# Patient Record
Sex: Female | Born: 1965 | Race: Black or African American | Hispanic: No | Marital: Single | Smoking: Never smoker
Health system: Southern US, Community
[De-identification: ages and names within clinical notes are randomized; demographics above are authoritative.]

## PROBLEM LIST (undated history)

## (undated) DIAGNOSIS — F32A Depression, unspecified: Secondary | ICD-10-CM

## (undated) DIAGNOSIS — I10 Essential (primary) hypertension: Secondary | ICD-10-CM

## (undated) DIAGNOSIS — J45909 Unspecified asthma, uncomplicated: Secondary | ICD-10-CM

## (undated) DIAGNOSIS — H11009 Unspecified pterygium of unspecified eye: Secondary | ICD-10-CM

## (undated) DIAGNOSIS — E785 Hyperlipidemia, unspecified: Secondary | ICD-10-CM

## (undated) HISTORY — PX: HERNIA REPAIR: SHX51

## (undated) HISTORY — PX: CHOLECYSTECTOMY: SHX55

---

## 2018-11-23 ENCOUNTER — Emergency Department (HOSPITAL_BASED_OUTPATIENT_CLINIC_OR_DEPARTMENT_OTHER)
Admission: EM | Admit: 2018-11-23 | Discharge: 2018-11-24 | Disposition: A | Discharge status: Home / Self Care | Payer: 59 | Attending: Emergency Medicine | Admitting: Emergency Medicine

## 2018-11-23 ENCOUNTER — Other Ambulatory Visit: Payer: Self-pay

## 2018-11-23 ENCOUNTER — Encounter (HOSPITAL_BASED_OUTPATIENT_CLINIC_OR_DEPARTMENT_OTHER): Payer: Self-pay

## 2018-11-23 DIAGNOSIS — R42 Dizziness and giddiness: Secondary | ICD-10-CM | POA: Insufficient documentation

## 2018-11-23 DIAGNOSIS — R51 Headache: Secondary | ICD-10-CM | POA: Insufficient documentation

## 2018-11-23 DIAGNOSIS — R519 Headache, unspecified: Secondary | ICD-10-CM

## 2018-11-23 HISTORY — DX: Unspecified pterygium of unspecified eye: H11.009

## 2018-11-23 NOTE — ED Triage Notes (Signed)
t HA x 1 week-today at work she felt dizzy-she checked BP at home 148/105-pt does not have HTN hx except during pregnancy at age 53-NAD-steady gait

## 2018-11-24 ENCOUNTER — Emergency Department (HOSPITAL_BASED_OUTPATIENT_CLINIC_OR_DEPARTMENT_OTHER): Payer: 59

## 2018-11-24 LAB — CBC WITH DIFFERENTIAL/PLATELET
Abs Immature Granulocytes: 0.02 10*3/uL (ref 0.00–0.07)
Basophils Absolute: 0 10*3/uL (ref 0.0–0.1)
Basophils Relative: 0 %
Eosinophils Absolute: 0 10*3/uL (ref 0.0–0.5)
Eosinophils Relative: 0 %
HCT: 43.6 % (ref 36.0–46.0)
Hemoglobin: 13.8 g/dL (ref 12.0–15.0)
Immature Granulocytes: 0 %
Lymphocytes Relative: 21 %
Lymphs Abs: 1.3 10*3/uL (ref 0.7–4.0)
MCH: 29.1 pg (ref 26.0–34.0)
MCHC: 31.7 g/dL (ref 30.0–36.0)
MCV: 92 fL (ref 80.0–100.0)
Monocytes Absolute: 0.4 10*3/uL (ref 0.1–1.0)
Monocytes Relative: 7 %
Neutro Abs: 4.6 10*3/uL (ref 1.7–7.7)
Neutrophils Relative %: 72 %
Platelets: 212 10*3/uL (ref 150–400)
RBC: 4.74 MIL/uL (ref 3.87–5.11)
RDW: 12.7 % (ref 11.5–15.5)
WBC: 6.4 10*3/uL (ref 4.0–10.5)
nRBC: 0 % (ref 0.0–0.2)

## 2018-11-24 LAB — BASIC METABOLIC PANEL
Anion gap: 11 (ref 5–15)
BUN: 12 mg/dL (ref 6–20)
CO2: 25 mmol/L (ref 22–32)
Calcium: 9.3 mg/dL (ref 8.9–10.3)
Chloride: 101 mmol/L (ref 98–111)
Creatinine, Ser: 0.96 mg/dL (ref 0.44–1.00)
GFR calc Af Amer: >60 mL/min (ref >60)
GFR calc non Af Amer: >60 mL/min (ref >60)
Glucose, Bld: 103 mg/dL — ABNORMAL HIGH (ref 70–99)
Potassium: 4.1 mmol/L (ref 3.5–5.1)
Sodium: 137 mmol/L (ref 135–145)

## 2018-11-24 LAB — URINALYSIS, ROUTINE W REFLEX MICROSCOPIC
Bilirubin Urine: NEGATIVE
Glucose, UA: NEGATIVE mg/dL
Hgb urine dipstick: NEGATIVE
Ketones, ur: NEGATIVE mg/dL
Leukocytes,Ua: NEGATIVE
Nitrite: NEGATIVE
Protein, ur: NEGATIVE mg/dL
Specific Gravity, Urine: 1.01 (ref 1.005–1.030)
pH: 7 (ref 5.0–8.0)

## 2018-11-24 LAB — MAGNESIUM: Magnesium: 2.3 mg/dL (ref 1.7–2.4)

## 2018-11-24 MED ORDER — METOCLOPRAMIDE HCL 5 MG/ML IJ SOLN
10.0000 mg | Freq: Once | INTRAMUSCULAR | Status: AC
  Administered 2018-11-24: 10 mg via INTRAVENOUS
  Filled 2018-11-24: qty 2

## 2018-11-24 MED ORDER — KETOROLAC TROMETHAMINE 15 MG/ML IJ SOLN
15.0000 mg | Freq: Once | INTRAMUSCULAR | Status: AC
  Administered 2018-11-24: 03:00:00 15 mg via INTRAVENOUS
  Filled 2018-11-24: qty 1

## 2018-11-24 MED ORDER — DIPHENHYDRAMINE HCL 50 MG/ML IJ SOLN
25.0000 mg | Freq: Once | INTRAMUSCULAR | Status: AC
  Administered 2018-11-24: 03:00:00 25 mg via INTRAVENOUS
  Filled 2018-11-24: qty 1

## 2018-11-24 NOTE — ED Notes (Signed)
ED Provider at bedside. 

## 2018-11-24 NOTE — ED Provider Notes (Signed)
[facility] Provider Note: Georgena Spurling, MD, FACEP  CSN: [id number] MRN: [id number] ARRIVAL: 11/23/18 at 2146 ROOM: [place]  Headache   HISTORY OF PRESENT ILLNESS  11/24/18 1:20 AM Kelsey Shaw is a 53 y.o. female who is been having headaches for about a week.  She does not usually have headaches.  Her headaches are generalized and she rates the pain as a 3 out of 10 presently.  She has been taking Tylenol without relief.  She has no associated nausea or vomiting.  She had several episodes of dizziness yesterday.  They lasted about 5 minutes.  She characterizes these as feeling like she is going to fall down or a sensation of lightheadedness.  She has been seeing a neurologist for "twitching" and has a follow-up appointment on October 2.   Past Medical History:  Diagnosis Date  . Pterygium     Past Surgical History:  Procedure Laterality Date  . CESAREAN SECTION    . CHOLECYSTECTOMY    . HERNIA REPAIR      No family history on file.  Social History   Tobacco Use  . Smoking status: Never Smoker  . Smokeless tobacco: Never Used  Substance Use Topics  . Alcohol use: Yes    Comment: occ  . Drug use: Never    Prior to Admission medications   Not on File    Allergies Ivp dye [iodinated diagnostic agents]   REVIEW OF SYSTEMS  Negative except as noted here or in the History of Present Illness.   PHYSICAL EXAMINATION  Initial Vital Signs Blood pressure 127/80, pulse 78, temperature 98.6 F (37 C), temperature source Oral, resp. rate 16, height 5\' 3"  (1.6 m), weight 98 kg, SpO2 100 %.  Examination General: Well-developed, well-nourished female in no acute distress; appearance consistent with age of record HENT: normocephalic; atraumatic Eyes: pupils equal, round and reactive to light; extraocular muscles intact; bilateral pterigium Neck: supple Heart: regular rate and rhythm Lungs: clear to auscultation bilaterally  Abdomen: soft; nondistended; nontender; no masses or hepatosplenomegaly; bowel sounds present Extremities: No deformity; full range of motion; pulses normal Neurologic: Awake, alert and oriented; motor function intact in all extremities and symmetric; no facial droop; no pronator drift; normal finger-to-nose Skin: Warm and dry Psychiatric: Normal mood and affect   RESULTS  Summary of this visit's results, reviewed by myself:   EKG Interpretation  Date/Time:  Thursday November 24 2018 00:13:00 EDT Ventricular Rate:  74 PR Interval:    QRS Duration: 82 QT Interval:  383 QTC Calculation: 425 R Axis:   61 Text Interpretation:  Sinus rhythm Normal ECG Confirmed by Johnte Portnoy [phone]) on 11/24/2018 12:17:42 AM      Laboratory Studies: Results for orders placed or performed during the hospital encounter of 11/23/18 (from the past 24 hour(s))  CBC with Differential/Platelet     Status: None   Collection Time: 11/24/18  1:41 AM  Result Value Ref Range   WBC 6.4 4.0 - 10.5 K/uL   RBC 4.74 3.87 - 5.11 MIL/uL   Hemoglobin 13.8 12.0 - 15.0 g/dL   HCT 43.6 36.0 - 46.0 %   MCV 92.0 80.0 - 100.0 fL   MCH 29.1 26.0 - 34.0 pg   MCHC 31.7 30.0 - 36.0 g/dL   RDW 12.7 11.5 - 15.5 %   Platelets 212 150 - 400 K/uL   nRBC 0.0 0.0 - 0.2 %   Neutrophils Relative % 72 %   Neutro Abs  4.6 1.7 - 7.7 K/uL   Lymphocytes Relative 21 %   Lymphs Abs 1.3 0.7 - 4.0 K/uL   Monocytes Relative 7 %   Monocytes Absolute 0.4 0.1 - 1.0 K/uL   Eosinophils Relative 0 %   Eosinophils Absolute 0.0 0.0 - 0.5 K/uL   Basophils Relative 0 %   Basophils Absolute 0.0 0.0 - 0.1 K/uL   Immature Granulocytes 0 %   Abs Immature Granulocytes 0.02 0.00 - 0.07 K/uL  Basic metabolic panel     Status: Abnormal   Collection Time: 11/24/18  1:41 AM  Result Value Ref Range   Sodium 137 135 - 145 mmol/L   Potassium 4.1 3.5 - 5.1 mmol/L   Chloride 101 98 - 111 mmol/L   CO2 25 22 - 32 mmol/L   Glucose, Bld 103 (H) 70 - 99  mg/dL   BUN 12 6 - 20 mg/dL   Creatinine, Ser 1.610.96 0.44 - 1.00 mg/dL   Calcium 9.3 8.9 - 09.610.3 mg/dL   GFR calc non Af Amer >60 >60 mL/min   GFR calc Af Amer >60 >60 mL/min   Anion gap 11 5 - 15  Magnesium     Status: None   Collection Time: 11/24/18  1:41 AM  Result Value Ref Range   Magnesium 2.3 1.7 - 2.4 mg/dL  Urinalysis, Routine w reflex microscopic     Status: None   Collection Time: 11/24/18  1:41 AM  Result Value Ref Range   Color, Urine YELLOW YELLOW   APPearance CLEAR CLEAR   Specific Gravity, Urine 1.010 1.005 - 1.030   pH 7.0 5.0 - 8.0   Glucose, UA NEGATIVE NEGATIVE mg/dL   Hgb urine dipstick NEGATIVE NEGATIVE   Bilirubin Urine NEGATIVE NEGATIVE   Ketones, ur NEGATIVE NEGATIVE mg/dL   Protein, ur NEGATIVE NEGATIVE mg/dL   Nitrite NEGATIVE NEGATIVE   Leukocytes,Ua NEGATIVE NEGATIVE   Imaging Studies: Ct Head Wo Contrast  Result Date: 11/24/2018 CLINICAL DATA:  New onset headaches EXAM: CT HEAD WITHOUT CONTRAST TECHNIQUE: Contiguous axial images were obtained from the base of the skull through the vertex without intravenous contrast. COMPARISON:  None. FINDINGS: Brain: No evidence of acute infarction, hemorrhage, hydrocephalus, extra-axial collection or mass lesion/mass effect. Vascular: No hyperdense vessel or unexpected calcification. Skull: Normal. Negative for fracture or focal lesion. Sinuses/Orbits: No acute finding. Other: None. IMPRESSION: Normal head CT for age Electronically Signed   By: Alcide CleverMark  Lukens M.D.   On: 11/24/2018 02:12    ED COURSE and MDM  Nursing notes and initial vitals signs, including pulse oximetry, reviewed.  Vitals:   11/24/18 0005 11/24/18 0106 11/24/18 0159 11/24/18 0257  BP: (!) 141/93 127/80 136/89 (!) 160/88  Pulse: 82 78 70 80  Resp: 16 16 16 18   Temp:      TempSrc:      SpO2: 100% 100% 99% 99%  Weight:      Height:       3:44 AM Headache improved after IV medications.  As noted above patient has a follow-up with her  neurologist on October 2.  PROCEDURES    ED DIAGNOSES     ICD-10-CM   1. New onset headache  R51        Aidric Endicott, MD 11/24/18 0345

## 2018-11-24 NOTE — ED Notes (Signed)
PT c/o light headed and headache for over 1 week. Pt states " felt like she was going to faint earlier today". Denies chest pain. No n/v/d. Pt ambulatory with steady gait. Denies feeling dizzy or lightheaded at present. C/o dull headache

## 2018-11-24 NOTE — ED Notes (Signed)
Patient transported to CT 

## 2020-06-16 ENCOUNTER — Other Ambulatory Visit: Payer: Self-pay

## 2020-06-16 ENCOUNTER — Encounter (HOSPITAL_BASED_OUTPATIENT_CLINIC_OR_DEPARTMENT_OTHER): Payer: Self-pay | Admitting: Emergency Medicine

## 2020-06-16 ENCOUNTER — Emergency Department (HOSPITAL_BASED_OUTPATIENT_CLINIC_OR_DEPARTMENT_OTHER)
Admission: EM | Admit: 2020-06-16 | Discharge: 2020-06-17 | Disposition: A | Discharge status: Home / Self Care | Payer: 59 | Attending: Emergency Medicine | Admitting: Emergency Medicine

## 2020-06-16 DIAGNOSIS — J45909 Unspecified asthma, uncomplicated: Secondary | ICD-10-CM | POA: Diagnosis not present

## 2020-06-16 DIAGNOSIS — Z79899 Other long term (current) drug therapy: Secondary | ICD-10-CM | POA: Insufficient documentation

## 2020-06-16 DIAGNOSIS — I10 Essential (primary) hypertension: Secondary | ICD-10-CM | POA: Diagnosis present

## 2020-06-16 HISTORY — DX: Depression, unspecified: F32.A

## 2020-06-16 HISTORY — DX: Unspecified asthma, uncomplicated: J45.909

## 2020-06-16 NOTE — ED Triage Notes (Addendum)
Pt reports bp at home 210/120 and that she feels that her heart is racing. Pt also reports epigastric pain that feels "like heartburn or like food is stuck there". Denies personal hx of HTN. Pt states when she got elevated readings today she took one of her friend's prescription HTN meds 30 min PTA. She is unsure of the name of medication but is trying to find out.   Addended to add medication taken by pt was Telmisartan 40 mg.

## 2020-06-17 LAB — CBC WITH DIFFERENTIAL/PLATELET
Abs Immature Granulocytes: 0.02 10*3/uL (ref 0.00–0.07)
Basophils Absolute: 0 10*3/uL (ref 0.0–0.1)
Basophils Relative: 0 %
Eosinophils Absolute: 0 10*3/uL (ref 0.0–0.5)
Eosinophils Relative: 0 %
HCT: 41.3 % (ref 36.0–46.0)
Hemoglobin: 13.6 g/dL (ref 12.0–15.0)
Immature Granulocytes: 1 %
Lymphocytes Relative: 31 %
Lymphs Abs: 1.3 10*3/uL (ref 0.7–4.0)
MCH: 29.2 pg (ref 26.0–34.0)
MCHC: 32.9 g/dL (ref 30.0–36.0)
MCV: 88.8 fL (ref 80.0–100.0)
Monocytes Absolute: 0.4 10*3/uL (ref 0.1–1.0)
Monocytes Relative: 10 %
Neutro Abs: 2.5 10*3/uL (ref 1.7–7.7)
Neutrophils Relative %: 58 %
Platelets: 186 10*3/uL (ref 150–400)
RBC: 4.65 MIL/uL (ref 3.87–5.11)
RDW: 12.5 % (ref 11.5–15.5)
WBC: 4.2 10*3/uL (ref 4.0–10.5)
nRBC: 0 % (ref 0.0–0.2)

## 2020-06-17 LAB — BASIC METABOLIC PANEL
Anion gap: 9 (ref 5–15)
BUN: 13 mg/dL (ref 6–20)
CO2: 26 mmol/L (ref 22–32)
Calcium: 9 mg/dL (ref 8.9–10.3)
Chloride: 102 mmol/L (ref 98–111)
Creatinine, Ser: 0.86 mg/dL (ref 0.44–1.00)
GFR, Estimated: >60 mL/min (ref >60)
Glucose, Bld: 105 mg/dL — ABNORMAL HIGH (ref 70–99)
Potassium: 3.8 mmol/L (ref 3.5–5.1)
Sodium: 137 mmol/L (ref 135–145)

## 2020-06-17 MED ORDER — TELMISARTAN 40 MG PO TABS: 40.0000 mg | ORAL_TABLET | Freq: Every day | ORAL | 0 refills | Status: AC

## 2020-06-17 NOTE — ED Notes (Signed)
EKG given to Paula Libra MD.

## 2020-06-17 NOTE — ED Provider Notes (Signed)
[facility] Provider Note: Kelsey Dell, MD, FACEP  CSN: [id number] MRN: [id number] ARRIVAL: 06/16/20 at 2318 ROOM: [facility]   CHIEF COMPLAINT  Hypertension   HISTORY OF PRESENT ILLNESS  06/17/20 12:04 AM Kelsey Shaw is a 55 y.o. female who was seen by her PCP 3 days ago and her blood pressure was found to be 145/109.  Her PCP had her start logging her blood pressures over the weekend and she noticed that they have been running high, as high as 210/120.  She became anxious yesterday evening and took a friend's telmisartan 40 mg.  On arrival here her blood pressure is 167/102, rechecked at 150/92.  She has had no chest pain or shortness of breath but she does feel like her heart is beating rapidly (although heart rate on the monitor as she says this is 80).  She states her PCP due to no blood work.   Past Medical History:  Diagnosis Date  . Asthma   . Depression   . Pterygium     Past Surgical History:  Procedure Laterality Date  . CESAREAN SECTION    . CHOLECYSTECTOMY    . HERNIA REPAIR      No family history on file.  Social History   Tobacco Use  . Smoking status: Never Smoker  . Smokeless tobacco: Never Used  Vaping Use  . Vaping Use: Never used  Substance Use Topics  . Alcohol use: Yes    Comment: occ  . Drug use: Never    Prior to Admission medications   Medication Sig Start Date End Date Taking? Authorizing Provider  telmisartan (MICARDIS) 40 MG tablet Take 1 tablet (40 mg total) by mouth daily. 06/17/20  Yes Jhonnie Aliano, MD    Allergies Ivp dye [iodinated diagnostic agents]   REVIEW OF SYSTEMS  Negative except as noted here or in the History of Present Illness.   PHYSICAL EXAMINATION  Initial Vital Signs Blood pressure (!) 150/92, pulse 69, temperature 98.5 F (36.9 C), temperature source Oral, resp. rate 19, height 5\' 4"  (1.626 m), weight 100.2 kg, SpO2 100 %.  Examination General: Well-developed, well-nourished female in  no acute distress; appearance consistent with age of record HENT: normocephalic; atraumatic Eyes: pupils equal, round and reactive to light; extraocular muscles intact; bilateral pterygia Neck: supple Heart: regular rate and rhythm Lungs: clear to auscultation bilaterally Abdomen: soft; nondistended; nontender; nbowel sounds present Extremities: No deformity; full range of motion; pulses normal Neurologic: Awake, alert and oriented; motor function intact in all extremities and symmetric; no facial droop Skin: Warm and dry Psychiatric: Normal mood and affect   RESULTS  Summary of this visit's results, reviewed and interpreted by myself:   EKG Interpretation  Date/Time:  Monday June 17 2020 00:13:23 EDT Ventricular Rate:  74 PR Interval:  134 QRS Duration: 83 QT Interval:  392 QTC Calculation: 435 R Axis:   38 Text Interpretation: duplicate delete Confirmed by [id number] (Paula Libra) on 06/17/2020 12:24:41 AM      Laboratory Studies: Results for orders placed or performed during the hospital encounter of 06/16/20 (from the past 24 hour(s))  CBC with Differential/Platelet     Status: None   Collection Time: 06/17/20 12:19 AM  Result Value Ref Range   WBC 4.2 4.0 - 10.5 K/uL   RBC 4.65 3.87 - 5.11 MIL/uL   Hemoglobin 13.6 12.0 - 15.0 g/dL   HCT 08/17/20 32.9 - 92.4 %   MCV 88.8 80.0 - 100.0 fL   MCH 29.2  26.0 - 34.0 pg   MCHC 32.9 30.0 - 36.0 g/dL   RDW 29.5 74.7 - 34.0 %   Platelets 186 150 - 400 K/uL   nRBC 0.0 0.0 - 0.2 %   Neutrophils Relative % 58 %   Neutro Abs 2.5 1.7 - 7.7 K/uL   Lymphocytes Relative 31 %   Lymphs Abs 1.3 0.7 - 4.0 K/uL   Monocytes Relative 10 %   Monocytes Absolute 0.4 0.1 - 1.0 K/uL   Eosinophils Relative 0 %   Eosinophils Absolute 0.0 0.0 - 0.5 K/uL   Basophils Relative 0 %   Basophils Absolute 0.0 0.0 - 0.1 K/uL   Immature Granulocytes 1 %   Abs Immature Granulocytes 0.02 0.00 - 0.07 K/uL  Basic metabolic panel     Status: Abnormal    Collection Time: 06/17/20 12:19 AM  Result Value Ref Range   Sodium 137 135 - 145 mmol/L   Potassium 3.8 3.5 - 5.1 mmol/L   Chloride 102 98 - 111 mmol/L   CO2 26 22 - 32 mmol/L   Glucose, Bld 105 (H) 70 - 99 mg/dL   BUN 13 6 - 20 mg/dL   Creatinine, Ser 3.70 0.44 - 1.00 mg/dL   Calcium 9.0 8.9 - 96.4 mg/dL   GFR, Estimated >38 >38 mL/min   Anion gap 9 5 - 15   Imaging Studies: No results found.  ED COURSE and MDM  Nursing notes, initial and subsequent vitals signs, including pulse oximetry, reviewed and interpreted by myself.  Vitals:   06/16/20 2325 06/16/20 2328 06/16/20 2345  BP: (!) 167/102  (!) 150/92  Pulse: (!) 109  69  Resp: 19  19  Temp: 98.5 F (36.9 C)    TempSrc: Oral    SpO2: 96%  100%  Weight:  100.2 kg   Height:  5\' 4"  (1.626 m)    Medications - No data to display  Patient advised of reassuring lab work and EKG.  We will go ahead and start her on telmisartan and have her follow-up with her PCP.  She was advised not to be overly concerned if her blood pressure does not normalize rapidly because more gradual correction is preferred.  PROCEDURES  Procedures   ED DIAGNOSES     ICD-10-CM   1. Hypertension not at goal  [facility], [facility], MD 06/17/20 [phone]

## 2020-06-19 ENCOUNTER — Encounter (HOSPITAL_BASED_OUTPATIENT_CLINIC_OR_DEPARTMENT_OTHER): Payer: Self-pay

## 2020-06-19 ENCOUNTER — Other Ambulatory Visit: Payer: Self-pay

## 2020-06-19 ENCOUNTER — Emergency Department (HOSPITAL_BASED_OUTPATIENT_CLINIC_OR_DEPARTMENT_OTHER)
Admission: EM | Admit: 2020-06-19 | Discharge: 2020-06-19 | Disposition: A | Discharge status: Home / Self Care | Payer: 59 | Attending: Emergency Medicine | Admitting: Emergency Medicine

## 2020-06-19 ENCOUNTER — Emergency Department (HOSPITAL_BASED_OUTPATIENT_CLINIC_OR_DEPARTMENT_OTHER): Payer: 59

## 2020-06-19 DIAGNOSIS — R0789 Other chest pain: Secondary | ICD-10-CM | POA: Diagnosis present

## 2020-06-19 DIAGNOSIS — R062 Wheezing: Secondary | ICD-10-CM | POA: Diagnosis not present

## 2020-06-19 DIAGNOSIS — R6883 Chills (without fever): Secondary | ICD-10-CM | POA: Insufficient documentation

## 2020-06-19 DIAGNOSIS — R110 Nausea: Secondary | ICD-10-CM | POA: Insufficient documentation

## 2020-06-19 DIAGNOSIS — R0602 Shortness of breath: Secondary | ICD-10-CM | POA: Diagnosis not present

## 2020-06-19 DIAGNOSIS — Z20822 Contact with and (suspected) exposure to covid-19: Secondary | ICD-10-CM | POA: Diagnosis not present

## 2020-06-19 DIAGNOSIS — R197 Diarrhea, unspecified: Secondary | ICD-10-CM | POA: Insufficient documentation

## 2020-06-19 DIAGNOSIS — R42 Dizziness and giddiness: Secondary | ICD-10-CM | POA: Insufficient documentation

## 2020-06-19 HISTORY — DX: Hyperlipidemia, unspecified: E78.5

## 2020-06-19 LAB — CBC WITH DIFFERENTIAL/PLATELET
Abs Immature Granulocytes: 0.01 10*3/uL (ref 0.00–0.07)
Basophils Absolute: 0 10*3/uL (ref 0.0–0.1)
Basophils Relative: 1 %
Eosinophils Absolute: 0 10*3/uL (ref 0.0–0.5)
Eosinophils Relative: 0 %
HCT: 44.9 % (ref 36.0–46.0)
Hemoglobin: 14.5 g/dL (ref 12.0–15.0)
Immature Granulocytes: 0 %
Lymphocytes Relative: 29 %
Lymphs Abs: 0.9 10*3/uL (ref 0.7–4.0)
MCH: 28.8 pg (ref 26.0–34.0)
MCHC: 32.3 g/dL (ref 30.0–36.0)
MCV: 89.3 fL (ref 80.0–100.0)
Monocytes Absolute: 0.3 10*3/uL (ref 0.1–1.0)
Monocytes Relative: 9 %
Neutro Abs: 2 10*3/uL (ref 1.7–7.7)
Neutrophils Relative %: 61 %
Platelets: 202 10*3/uL (ref 150–400)
RBC: 5.03 MIL/uL (ref 3.87–5.11)
RDW: 12.5 % (ref 11.5–15.5)
WBC: 3.3 10*3/uL — ABNORMAL LOW (ref 4.0–10.5)
nRBC: 0 % (ref 0.0–0.2)

## 2020-06-19 LAB — COMPREHENSIVE METABOLIC PANEL
ALT: 26 U/L (ref 0–44)
AST: 29 U/L (ref 15–41)
Albumin: 4.4 g/dL (ref 3.5–5.0)
Alkaline Phosphatase: 61 U/L (ref 38–126)
Anion gap: 10 (ref 5–15)
BUN: 11 mg/dL (ref 6–20)
CO2: 28 mmol/L (ref 22–32)
Calcium: 9.4 mg/dL (ref 8.9–10.3)
Chloride: 98 mmol/L (ref 98–111)
Creatinine, Ser: 1.05 mg/dL — ABNORMAL HIGH (ref 0.44–1.00)
GFR, Estimated: >60 mL/min (ref >60)
Glucose, Bld: 99 mg/dL (ref 70–99)
Potassium: 3.7 mmol/L (ref 3.5–5.1)
Sodium: 136 mmol/L (ref 135–145)
Total Bilirubin: 0.7 mg/dL (ref 0.3–1.2)
Total Protein: 7.9 g/dL (ref 6.5–8.1)

## 2020-06-19 LAB — TROPONIN I (HIGH SENSITIVITY)
Troponin I (High Sensitivity): <2 ng/L (ref <18)
Troponin I (High Sensitivity): <2 ng/L (ref <18)

## 2020-06-19 LAB — D-DIMER, QUANTITATIVE: D-Dimer, Quant: <0.27 ug/mL-FEU (ref 0.00–0.50)

## 2020-06-19 LAB — SARS CORONAVIRUS 2 (TAT 6-24 HRS): SARS Coronavirus 2: NEGATIVE

## 2020-06-19 MED ORDER — ACETAMINOPHEN 325 MG PO TABS
650.0000 mg | ORAL_TABLET | Freq: Once | ORAL | Status: AC
  Administered 2020-06-19: 650 mg via ORAL
  Filled 2020-06-19: qty 2

## 2020-06-19 MED ORDER — ALBUTEROL SULFATE HFA 108 (90 BASE) MCG/ACT IN AERS
4.0000 | INHALATION_SPRAY | Freq: Once | RESPIRATORY_TRACT | Status: AC
  Administered 2020-06-19: 4 via RESPIRATORY_TRACT

## 2020-06-19 MED ORDER — AEROCHAMBER PLUS FLO-VU MEDIUM MISC
1.0000 | Freq: Once | Status: AC
  Administered 2020-06-19: 1
  Filled 2020-06-19: qty 1

## 2020-06-19 MED ORDER — ALBUTEROL SULFATE HFA 108 (90 BASE) MCG/ACT IN AERS
2.0000 | INHALATION_SPRAY | Freq: Once | RESPIRATORY_TRACT | Status: AC
  Administered 2020-06-19: 2 via RESPIRATORY_TRACT
  Filled 2020-06-19: qty 6.7

## 2020-06-19 MED ORDER — SODIUM CHLORIDE 0.9 % IV BOLUS
1000.0000 mL | Freq: Once | INTRAVENOUS | Status: AC
  Administered 2020-06-19: 1000 mL via INTRAVENOUS

## 2020-06-19 MED ORDER — ONDANSETRON HCL 4 MG/2ML IJ SOLN
4.0000 mg | Freq: Once | INTRAMUSCULAR | Status: AC
  Administered 2020-06-19: 4 mg via INTRAVENOUS
  Filled 2020-06-19: qty 2

## 2020-06-19 NOTE — ED Triage Notes (Signed)
Pt states has been having hypertension since this weekend. Last night began having nausea, chest heaviness & shortness of breath. Took a hydrochlorothiazide & telmisartan pta. Denies hx of same.

## 2020-06-19 NOTE — ED Provider Notes (Addendum)
[facility] EMERGENCY DEPARTMENT Provider Note   CSN: [id number] Arrival date & time: 06/19/20  0160     History Chief Complaint  Patient presents with  . Chest Pain    Kelsey Shaw is a 55 y.o. female.  HPI   Pt is a 55 y/o female with a h/o asthma, depression, HLD, pretygium who presents to the ED today for eval of chest pain to the upper part of the chest. She describes the discomfort as a pressure. Rates pain 6-7/10. sxs are constant. Denies any exacerbating or alleviating factors but does note associated sob that is worse with exertion. Denies any associated vomiting, diaphoresis, cough, hemoptysis, leg pain/swelling, hemoptysis, recent surgery/trauma, recent long travel, hormone use, personal hx of cancer, or hx of DVT/PE.   States she had nausea and Diarrhea a few days ago and had chills for the last few days. Denies known sick contacts but states she was at a wedding 4 days ago.   States that she just recently started BP medication this week. Last night her BP was still high despite taking medications and she was told by her PCP to take an extra dose of her medication. This morning pt felt lightheaded, chest pressure, and some shortness of breath.  She took her talmisartan and hctz this AM.    Denies h/o DM, tobacco use. Denies any early fam hx of heart disease.   Past Medical History:  Diagnosis Date  . Asthma   . Depression   . Hyperlipidemia   . Pterygium     There are no problems to display for this patient.   Past Surgical History:  Procedure Laterality Date  . CESAREAN SECTION    . CHOLECYSTECTOMY    . HERNIA REPAIR       OB History   No obstetric history on file.     History reviewed. No pertinent family history.  Social History   Tobacco Use  . Smoking status: Never Smoker  . Smokeless tobacco: Never Used  Vaping Use  . Vaping Use: Never used  Substance Use Topics  . Alcohol use: Yes    Comment: occ  . Drug use: Never     Home Medications Prior to Admission medications   Medication Sig Start Date End Date Taking? Authorizing Provider  hydrochlorothiazide (HYDRODIURIL) 25 MG tablet Take by mouth. 06/18/20  Yes [provider]  telmisartan (MICARDIS) 40 MG tablet Take 1 tablet (40 mg total) by mouth daily. 06/17/20  Yes Molpus, John, MD  albuterol (VENTOLIN HFA) 108 (90 Base) MCG/ACT inhaler TAKE 2 PUFFS BY MOUTH EVERY 6 HOURS AS NEEDED FOR WHEEZE 03/21/18   [provider]    Allergies    Ivp dye [iodinated diagnostic agents]  Review of Systems   Review of Systems  Constitutional: Positive for chills. Negative for diaphoresis and fever.  HENT: Negative for ear pain and sore throat.   Eyes: Negative for visual disturbance.  Respiratory: Positive for shortness of breath. Negative for cough.   Cardiovascular: Positive for chest pain. Negative for leg swelling.  Gastrointestinal: Positive for diarrhea (resolved) and nausea. Negative for abdominal pain, constipation and vomiting.  Genitourinary: Negative for dysuria and hematuria.  Musculoskeletal: Negative for back pain.  Skin: Negative for rash.  Neurological: Positive for light-headedness.  All other systems reviewed and are negative.   Physical Exam Updated Vital Signs BP 129/87   Pulse (!) 103   Temp 100.1 F (37.8 C)   Resp 14   Ht 5\' 4"  (  1.626 m)   Wt 99.8 kg   SpO2 98%   BMI 37.76 kg/m   Physical Exam Vitals and nursing note reviewed.  Constitutional:      General: She is not in acute distress.    Appearance: She is well-developed.  HENT:     Head: Normocephalic and atraumatic.  Eyes:     Conjunctiva/sclera: Conjunctivae normal.  Cardiovascular:     Rate and Rhythm: Normal rate and regular rhythm.     Heart sounds: No murmur heard.   Pulmonary:     Effort: Pulmonary effort is normal. No respiratory distress.     Breath sounds: Wheezing present. No rhonchi or rales.  Abdominal:     Palpations: Abdomen is  soft.     Tenderness: There is no abdominal tenderness.  Musculoskeletal:     Cervical back: Neck supple.  Skin:    General: Skin is warm and dry.  Neurological:     Mental Status: She is alert.     ED Results / Procedures / Treatments   Labs (all labs ordered are listed, but only abnormal results are displayed) Labs Reviewed  COMPREHENSIVE METABOLIC PANEL - Abnormal; Notable for the following components:      Result Value   Creatinine, Ser 1.05 (*)    All other components within normal limits  CBC WITH DIFFERENTIAL/PLATELET - Abnormal; Notable for the following components:   WBC 3.3 (*)    All other components within normal limits  D-DIMER, QUANTITATIVE  TROPONIN I (HIGH SENSITIVITY)  TROPONIN I (HIGH SENSITIVITY)    EKG EKG Interpretation  Date/Time:  Wednesday June 19 2020 10:04:41 EDT Ventricular Rate:  102 PR Interval:  127 QRS Duration: 76 QT Interval:  324 QTC Calculation: 422 R Axis:   25 Text Interpretation: Sinus tachycardia No significant change since last tracing , rate has increased Confirmed by Alvira Monday ([id number]) on 06/19/2020 10:26:39 AM   Radiology DG Chest 2 View  Result Date: 06/19/2020 CLINICAL DATA:  Shortness of breath EXAM: CHEST - 2 VIEW COMPARISON:  None. FINDINGS: The heart size and mediastinal contours are within normal limits. Both lungs are clear. The visualized skeletal structures are unremarkable. IMPRESSION: Normal study. Electronically Signed   By: Charlett Nose M.D.   On: 06/19/2020 11:10    Procedures Procedures   Medications Ordered in ED Medications  acetaminophen (TYLENOL) tablet 650 mg (650 mg Oral Given 06/19/20 1056)  sodium chloride 0.9 % bolus 1,000 mL (0 mLs Intravenous Stopped 06/19/20 1203)  ondansetron (ZOFRAN) injection 4 mg (4 mg Intravenous Given 06/19/20 1056)  albuterol (VENTOLIN HFA) 108 (90 Base) MCG/ACT inhaler 2 puff (2 puffs Inhalation Given 06/19/20 1134)  AeroChamber Plus Flo-Vu Medium MISC 1 each (1  each Other Given 06/19/20 1226)  albuterol (VENTOLIN HFA) 108 (90 Base) MCG/ACT inhaler 4 puff (4 puffs Inhalation Given 06/19/20 1226)    ED Course  I have reviewed the triage vital signs and the nursing notes.  Pertinent labs & imaging results that were available during my care of the patient were reviewed by me and considered in my medical decision making (see chart for details).    MDM Rules/Calculators/A&P                          55 year old female presenting for evaluation of chest pain and shortness of breath.  Reviewed/interpreted labs which showed a mild leukopenia, slightly elevated creatinine, normal LFTs and liver enzymes.  2 - troponins and a negative  D-dimer.  EKG shows normal sinus rhythm with no acute ischemic changes.  Chest x-ray reviewed/interpreted and shows no evidence of pneumonia, pneumothorax or other emergent abnormality.  Heart score 4. I have low suspicion that pts chest pain is related to ACS, PE, hypertensive emergency, dissection, esophageal perforation or other emergent pathology at this time.  She does have a fever today and has had some diarrhea and chills earlier this week.  I suspect that her symptoms may be due to a viral infection and Covid is being considered.  Her test is pending at the time of discharge.  She is well-appearing and feels improved after receiving albuterol in the ED.  She was ambulated and sats did not drop below 95% on room air.  We will have her continue albuterol as an outpatient, monitor symptoms and follow-up on her Covid test on her MyChart.  I advised that she make an appointment with her PCP in regards to her elevated blood pressure and advised that she return to the ED for new or worsening symptoms in the meantime.  Final Clinical Impression(s) / ED Diagnoses Final diagnoses:  Atypical chest pain    Rx / DC Orders ED Discharge Orders    None       Karrie Meres, PA-C 06/19/20 1358    Jessicalynn Deshong S, PA-C 06/19/20  1359    Alvira Monday, MD 06/21/20 [phone]

## 2020-06-19 NOTE — ED Provider Notes (Incomplete)
  [facility] EMERGENCY DEPARTMENT Provider Note   CSN: [id number] Arrival date & time: 06/19/20  2993     History Chief Complaint  Patient presents with  . Chest Pain    Kelsey Shaw is a 55 y.o. female.  HPI     Past Medical History:  Diagnosis Date  . Asthma   . Depression   . Hyperlipidemia   . Pterygium     There are no problems to display for this patient.   Past Surgical History:  Procedure Laterality Date  . CESAREAN SECTION    . CHOLECYSTECTOMY    . HERNIA REPAIR       OB History   No obstetric history on file.     History reviewed. No pertinent family history.  Social History   Tobacco Use  . Smoking status: Never Smoker  . Smokeless tobacco: Never Used  Vaping Use  . Vaping Use: Never used  Substance Use Topics  . Alcohol use: Yes    Comment: occ  . Drug use: Never    Home Medications Prior to Admission medications   Medication Sig Start Date End Date Taking? Authorizing Provider  hydrochlorothiazide (HYDRODIURIL) 25 MG tablet Take by mouth. 06/18/20  Yes [provider]  telmisartan (MICARDIS) 40 MG tablet Take 1 tablet (40 mg total) by mouth daily. 06/17/20  Yes Molpus, John, MD  albuterol (VENTOLIN HFA) 108 (90 Base) MCG/ACT inhaler TAKE 2 PUFFS BY MOUTH EVERY 6 HOURS AS NEEDED FOR WHEEZE 03/21/18   [provider]    Allergies    Ivp dye [iodinated diagnostic agents]  Review of Systems   Review of Systems  Physical Exam Updated Vital Signs BP (!) 156/100 (BP Location: Right Arm)   Pulse 99   Temp 99.4 F (37.4 C) (Oral)   Resp (!) 22   Ht 5\' 4"  (1.626 m)   Wt 99.8 kg   SpO2 100%   BMI 37.76 kg/m   Physical Exam  ED Results / Procedures / Treatments   Labs (all labs ordered are listed, but only abnormal results are displayed) Labs Reviewed - No data to display  EKG None  Radiology No results found.  Procedures Procedures {Remember to document critical care time when  appropriate:1}  Medications Ordered in ED Medications - No data to display  ED Course  I have reviewed the triage vital signs and the nursing notes.  Pertinent labs & imaging results that were available during my care of the patient were reviewed by me and considered in my medical decision making (see chart for details).    MDM Rules/Calculators/A&P                          *** Final Clinical Impression(s) / ED Diagnoses Final diagnoses:  None    Rx / DC Orders ED Discharge Orders    None

## 2020-06-19 NOTE — ED Notes (Signed)
Ambulated to on r/a, SpO2 98-100%, HR max 105, no DOE.

## 2020-06-19 NOTE — Discharge Instructions (Addendum)
Your covid test will result in 24 hours. Please follow up on mychart to check the result. If positive, you should be isolated for at least 5 days since the onset of your symptoms AND >72 hours after symptoms resolution (absence of fever without the use of fever reducing medicaiton and improvement in respiratory symptoms), whichever is longer  You may use 2 puffs of the albuterol inhaler as needed every 4-6 hours for shortness of breath.  Please follow up with your primary care provider within 5-7 days for re-evaluation of your symptoms. If you do not have a primary care provider, information for a healthcare clinic has been provided for you to make arrangements for follow up care. Please return to the emergency department for any new or worsening symptoms.

## 2020-06-19 NOTE — ED Notes (Signed)
Patient verbalized understanding of dc instructions, prescriptions, follow up referrals and reasons to return to ER for reevaluation.  

## 2021-01-24 IMAGING — CT CT HEAD W/O CM
3 series · 14 of 47 positions shown, 16 images · non-contrast
Comparison: None.

CLINICAL DATA: New onset headaches

EXAM:
CT HEAD WITHOUT CONTRAST
TECHNIQUE: Contiguous axial images were obtained from the base of the skull
through the vertex without intravenous contrast.

[Series 2: head wo · axial · 0.41mm/px · z∈[-188,-63]mm · 8 of 31 slices shown, 10 images]
[im 3/31  brain]
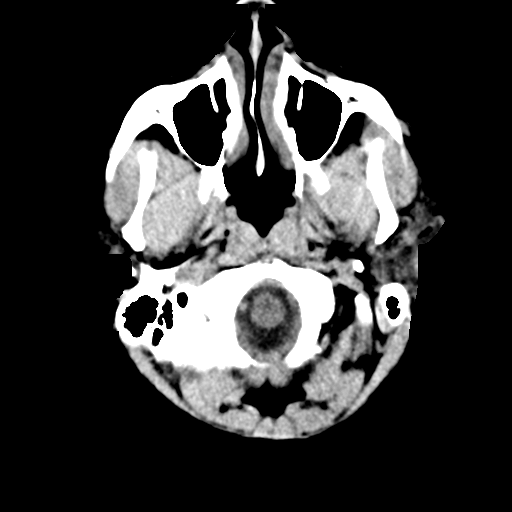
[im 3/31  bone]
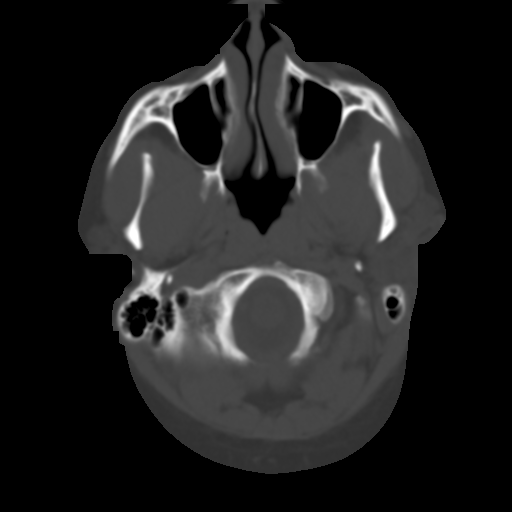
[im 7/31  brain]
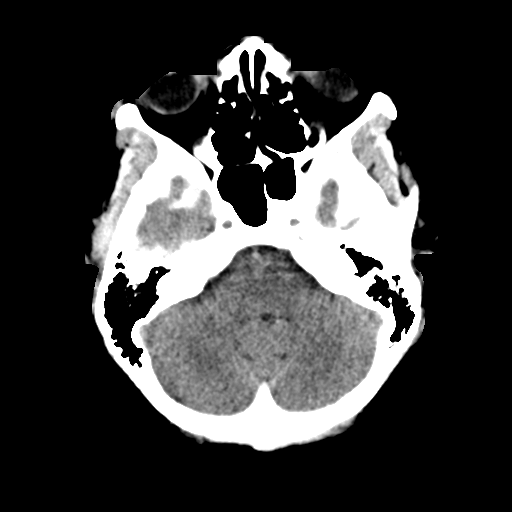
[im 10/31  brain]
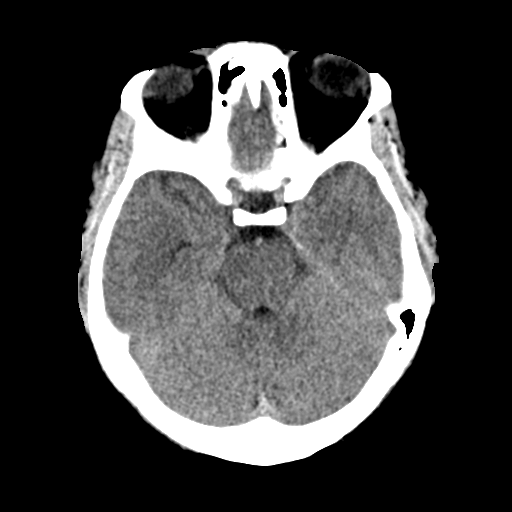
[im 14/31  brain]
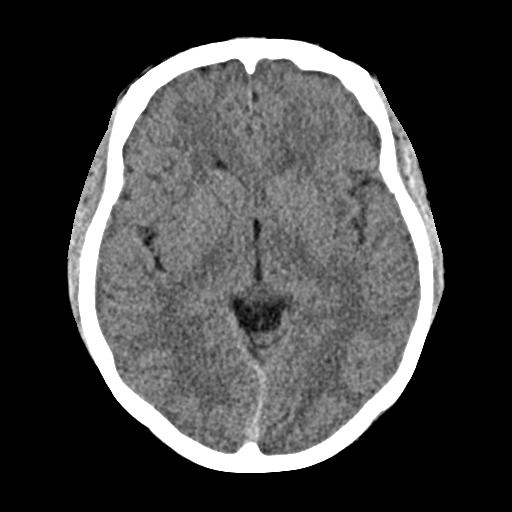
[im 17/31  brain]
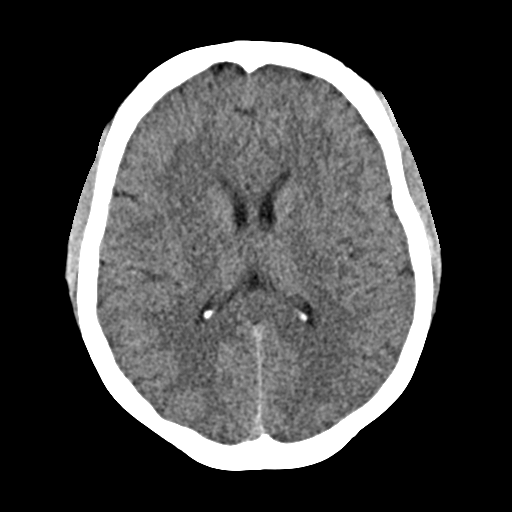
[im 17/31  bone]
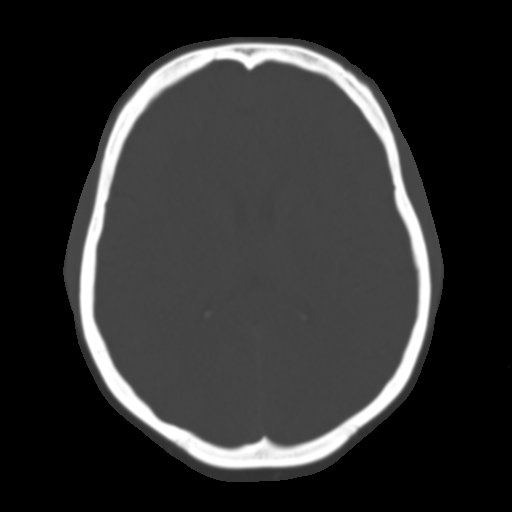
[im 21/31  brain]
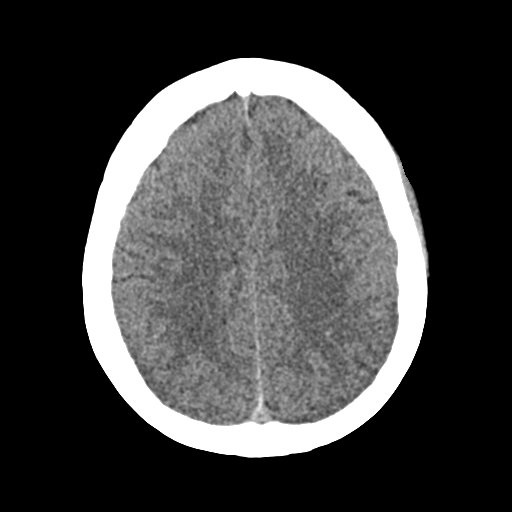
[im 24/31  brain]
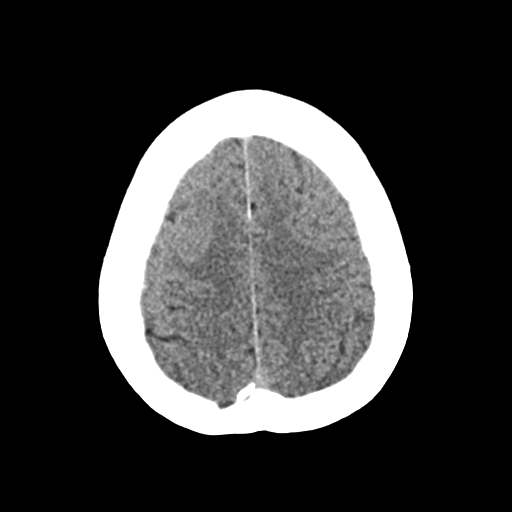
[im 28/31  brain]
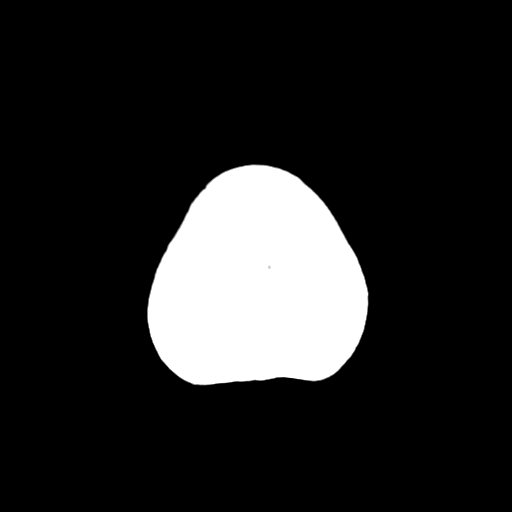

[Series 4: coronal soft · coronal · 0.31mm/px · 3 of 73 slices shown]
[im 25/73  brain]
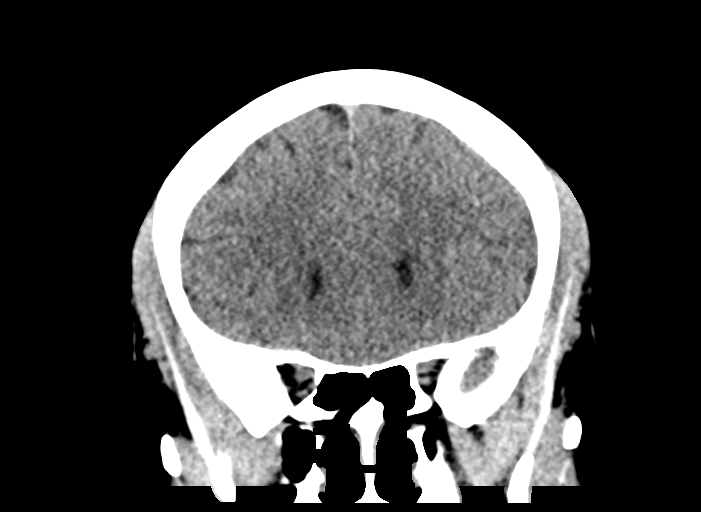
[im 33/73  brain]
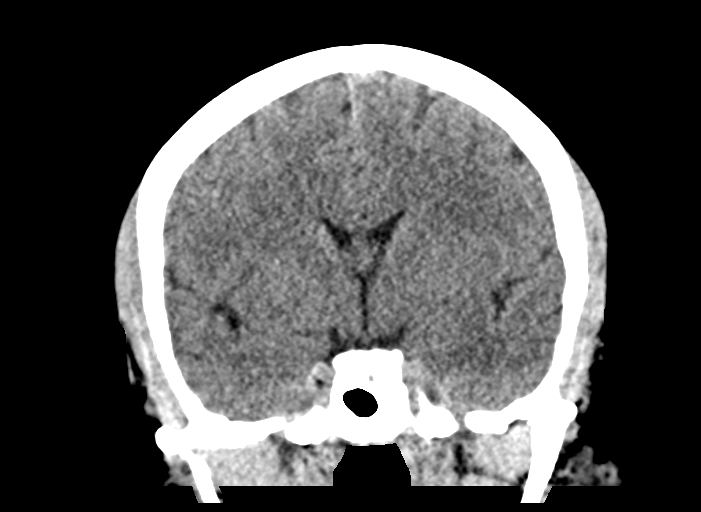
[im 41/73  brain]
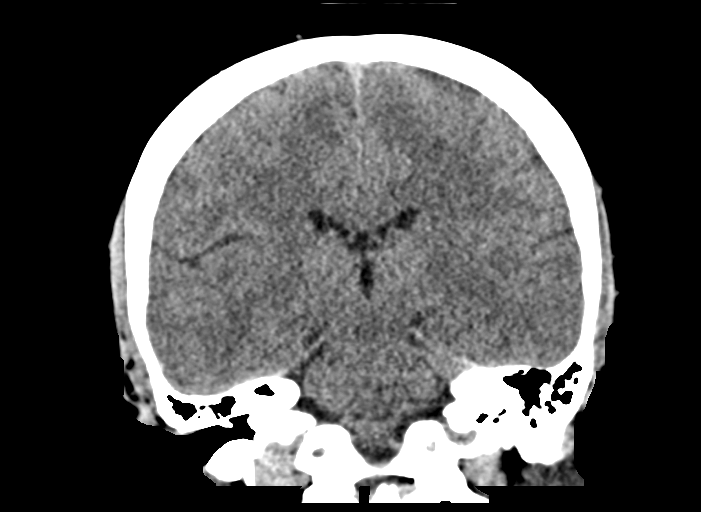

[Series 5: sag soft · sagittal · 0.32mm/px · 3 of 58 slices shown]
[im 20/58  brain]
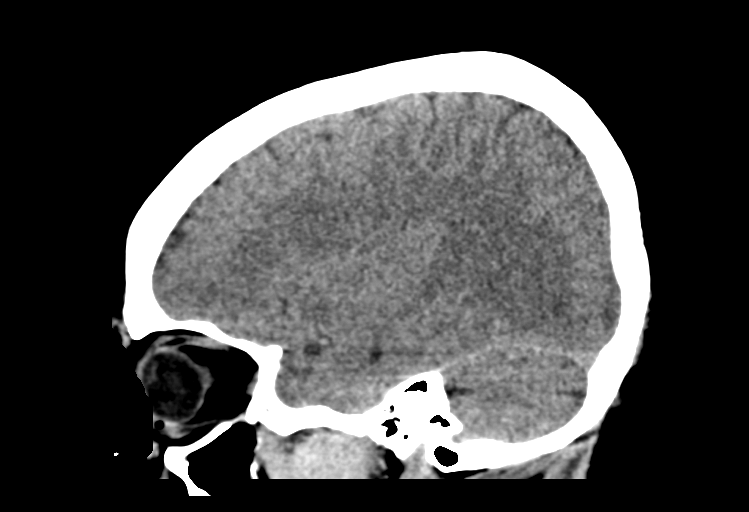
[im 29/58  brain]
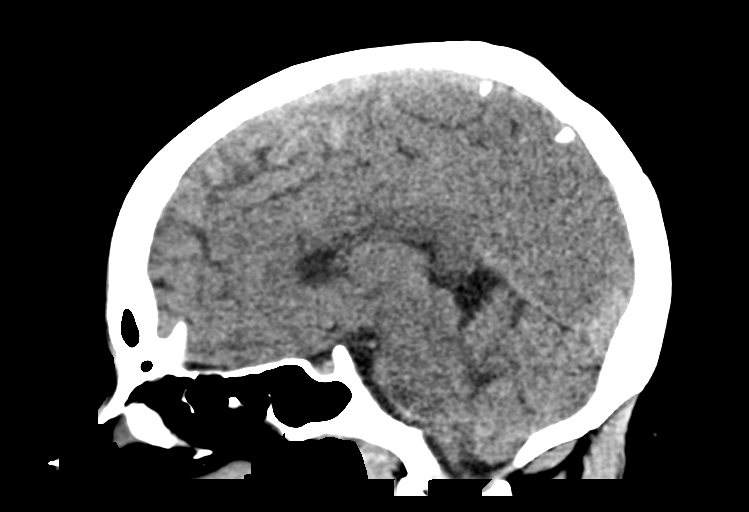
[im 39/58  brain]
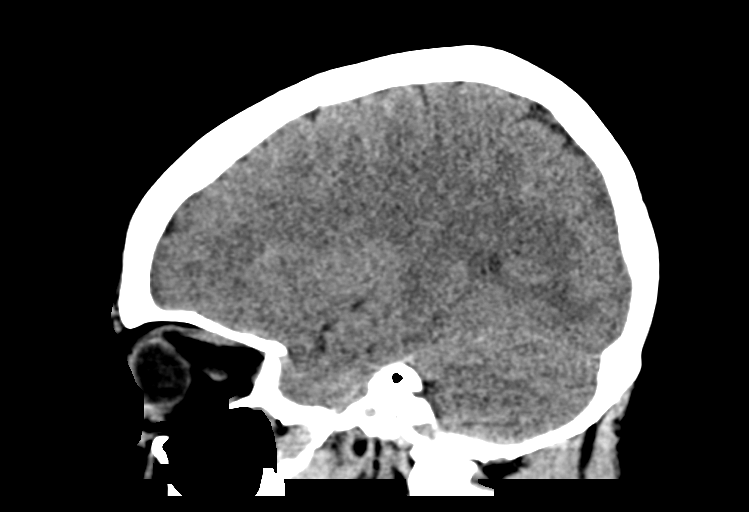

[14 of 47 positions shown; findings below may reference images not displayed]

FINDINGS: Brain: No evidence of acute infarction, hemorrhage, hydrocephalus,
extra-axial collection or mass lesion/mass effect.

Vascular: No hyperdense vessel or unexpected calcification.

Skull: Normal. Negative for fracture or focal lesion.

Sinuses/Orbits: No acute finding.

Other: None.
IMPRESSION: Normal head CT for age

## 2021-08-13 ENCOUNTER — Emergency Department (HOSPITAL_BASED_OUTPATIENT_CLINIC_OR_DEPARTMENT_OTHER): Payer: 59

## 2021-08-13 ENCOUNTER — Other Ambulatory Visit: Payer: Self-pay

## 2021-08-13 ENCOUNTER — Emergency Department (HOSPITAL_BASED_OUTPATIENT_CLINIC_OR_DEPARTMENT_OTHER)
Admission: EM | Admit: 2021-08-13 | Discharge: 2021-08-14 | Disposition: A | Discharge status: Home / Self Care | Payer: 59 | Attending: Emergency Medicine | Admitting: Emergency Medicine

## 2021-08-13 ENCOUNTER — Encounter (HOSPITAL_BASED_OUTPATIENT_CLINIC_OR_DEPARTMENT_OTHER): Payer: Self-pay | Admitting: Urology

## 2021-08-13 DIAGNOSIS — E876 Hypokalemia: Secondary | ICD-10-CM | POA: Insufficient documentation

## 2021-08-13 DIAGNOSIS — R110 Nausea: Secondary | ICD-10-CM | POA: Diagnosis not present

## 2021-08-13 DIAGNOSIS — R42 Dizziness and giddiness: Secondary | ICD-10-CM | POA: Diagnosis not present

## 2021-08-13 DIAGNOSIS — R630 Anorexia: Secondary | ICD-10-CM | POA: Diagnosis not present

## 2021-08-13 DIAGNOSIS — R0602 Shortness of breath: Secondary | ICD-10-CM | POA: Insufficient documentation

## 2021-08-13 DIAGNOSIS — R7989 Other specified abnormal findings of blood chemistry: Secondary | ICD-10-CM | POA: Diagnosis not present

## 2021-08-13 LAB — BASIC METABOLIC PANEL
Anion gap: 10 (ref 5–15)
BUN: 10 mg/dL (ref 6–20)
CO2: 27 mmol/L (ref 22–32)
Calcium: 9.6 mg/dL (ref 8.9–10.3)
Chloride: 96 mmol/L — ABNORMAL LOW (ref 98–111)
Creatinine, Ser: 1.06 mg/dL — ABNORMAL HIGH (ref 0.44–1.00)
GFR, Estimated: >60 mL/min (ref >60)
Glucose, Bld: 133 mg/dL — ABNORMAL HIGH (ref 70–99)
Potassium: 2.7 mmol/L — CL (ref 3.5–5.1)
Sodium: 133 mmol/L — ABNORMAL LOW (ref 135–145)

## 2021-08-13 LAB — MAGNESIUM: Magnesium: 1.9 mg/dL (ref 1.7–2.4)

## 2021-08-13 LAB — CBC
HCT: 43 % (ref 36.0–46.0)
Hemoglobin: 14.5 g/dL (ref 12.0–15.0)
MCH: 28.2 pg (ref 26.0–34.0)
MCHC: 33.7 g/dL (ref 30.0–36.0)
MCV: 83.7 fL (ref 80.0–100.0)
Platelets: 251 10*3/uL (ref 150–400)
RBC: 5.14 MIL/uL — ABNORMAL HIGH (ref 3.87–5.11)
RDW: 13.1 % (ref 11.5–15.5)
WBC: 5.4 10*3/uL (ref 4.0–10.5)
nRBC: 0 % (ref 0.0–0.2)

## 2021-08-13 LAB — TROPONIN I (HIGH SENSITIVITY)
Troponin I (High Sensitivity): 3 ng/L (ref <18)
Troponin I (High Sensitivity): 3 ng/L (ref <18)

## 2021-08-13 MED ORDER — ONDANSETRON HCL 4 MG/2ML IJ SOLN
INTRAMUSCULAR | Status: AC
  Administered 2021-08-13: 4 mg via INTRAVENOUS
  Filled 2021-08-13: qty 2

## 2021-08-13 MED ORDER — ONDANSETRON HCL 4 MG/2ML IJ SOLN: 4.0000 mg | Freq: Once | INTRAMUSCULAR | Status: AC

## 2021-08-13 MED ORDER — ONDANSETRON 8 MG PO TBDP: 8.0000 mg | ORAL_TABLET | Freq: Three times a day (TID) | ORAL | 0 refills | Status: AC | PRN

## 2021-08-13 MED ORDER — LACTATED RINGERS IV BOLUS
1000.0000 mL | Freq: Once | INTRAVENOUS | Status: AC
  Administered 2021-08-13: 1000 mL via INTRAVENOUS

## 2021-08-13 MED ORDER — POTASSIUM CHLORIDE CRYS ER 20 MEQ PO TBCR
40.0000 meq | EXTENDED_RELEASE_TABLET | Freq: Once | ORAL | Status: AC
  Administered 2021-08-13: 40 meq via ORAL
  Filled 2021-08-13: qty 2

## 2021-08-13 MED ORDER — ONDANSETRON 4 MG PO TBDP
4.0000 mg | ORAL_TABLET | Freq: Once | ORAL | Status: AC
Start: 2021-08-13 — End: 2021-08-13
  Administered 2021-08-13: 4 mg via ORAL
  Filled 2021-08-13: qty 1

## 2021-08-13 NOTE — ED Notes (Signed)
Patient verbalizes understanding of discharge instructions. Opportunity for questioning and answers were provided. Armband removed by staff, pt discharged from ED. Ambulated out to lobby  

## 2021-08-13 NOTE — ED Provider Notes (Signed)
[facility] EMERGENCY DEPARTMENT Provider Note   CSN: [id number] Arrival date & time: 08/13/21  1735     History  Chief Complaint  Patient presents with   Shortness of Breath   Dizziness    Kelsey Shaw is a 56 y.o. female.  HPI     56 year old female comes in with chief complaint of dizziness, lightheadedness, nausea and shortness of breath.  Her symptoms started 4 to 5 days ago while she was at work.  She suddenly started feeling dizziness, described as unsteadiness.  She also had associated nausea.  Symptoms are waxing and waning since then.  She has had some good days and bad days.  She denies any chest pain, but does indicate that she has had some exertional shortness of breath and on occasion she has felt discomfort over her neck.  Patient denies any trauma.  She has no history of similar symptoms.  She saw her PCP subsequently, her blood pressure and exam was reassuring, therefore she was advised to wait and watch.  Patient has past medical history of hyperlipidemia.  She denies any history of stroke.  She denies any heavy smoking, substance use disorder.  There is no family history of premature CAD or stroke.  Patient has reduced appetite, but denies any vomiting.  Home Medications Prior to Admission medications   Medication Sig Start Date End Date Taking? Authorizing Provider  ondansetron (ZOFRAN-ODT) 8 MG disintegrating tablet Take 1 tablet (8 mg total) by mouth every 8 (eight) hours as needed for nausea. 08/13/21  Yes Serenna Deroy, MD  albuterol (VENTOLIN HFA) 108 (90 Base) MCG/ACT inhaler TAKE 2 PUFFS BY MOUTH EVERY 6 HOURS AS NEEDED FOR WHEEZE 03/21/18   [provider]  hydrochlorothiazide (HYDRODIURIL) 25 MG tablet Take by mouth. 06/18/20   [provider]  telmisartan (MICARDIS) 40 MG tablet Take 1 tablet (40 mg total) by mouth daily. 06/17/20   Molpus, John, MD      Allergies    Ivp dye [iodinated contrast media]    Review of  Systems   Review of Systems  All other systems reviewed and are negative.  Physical Exam Updated Vital Signs BP 130/77   Pulse 79   Temp 98 F (36.7 C) (Oral)   Resp 18   Ht 5\' 4"  (1.626 m)   Wt 99.8 kg   SpO2 100%   BMI 37.77 kg/m  Physical Exam Vitals and nursing note reviewed.  Constitutional:      Appearance: She is well-developed.  HENT:     Head: Atraumatic.  Eyes:     Extraocular Movements: Extraocular movements intact.     Pupils: Pupils are equal, round, and reactive to light.     Comments: No nystagmus  Cardiovascular:     Rate and Rhythm: Normal rate.  Pulmonary:     Effort: Pulmonary effort is normal.     Breath sounds: No decreased breath sounds, wheezing, rhonchi or rales.  Musculoskeletal:     Cervical back: Normal range of motion and neck supple.     Right lower leg: No edema.     Left lower leg: No edema.  Skin:    General: Skin is warm and dry.  Neurological:     Mental Status: She is alert and oriented to person, place, and time.     Cranial Nerves: No cranial nerve deficit.     Motor: No weakness.    ED Results / Procedures / Treatments   Labs (all labs ordered are  listed, but only abnormal results are displayed) Labs Reviewed  BASIC METABOLIC PANEL - Abnormal; Notable for the following components:      Result Value   Sodium 133 (*)    Potassium 2.7 (*)    Chloride 96 (*)    Glucose, Bld 133 (*)    Creatinine, Ser 1.06 (*)    All other components within normal limits  CBC - Abnormal; Notable for the following components:   RBC 5.14 (*)    All other components within normal limits  MAGNESIUM  TROPONIN I (HIGH SENSITIVITY)  TROPONIN I (HIGH SENSITIVITY)    EKG EKG Interpretation  Date/Time:  Wednesday August 13 2021 17:50:12 EDT Ventricular Rate:  101 PR Interval:  128 QRS Duration: 66 QT Interval:  314 QTC Calculation: 407 R Axis:   16 Text Interpretation: Sinus tachycardia Nonspecific ST and T wave abnormality Abnormal ECG  When compared with ECG of 19-Jun-2020 10:04, PREVIOUS ECG IS PRESENT u waves seen in anterior leads Confirmed by Varney Biles [phone]) on 08/13/2021 7:31:16 PM  Radiology DG Chest 2 View  Result Date: 08/13/2021 CLINICAL DATA:  sob, neck tightness, lightheaded EXAM: CHEST - 2 VIEW COMPARISON:  Chest x-ray 06/19/2020 FINDINGS: The heart and mediastinal contours are unchanged. No focal consolidation. No pulmonary edema. No pleural effusion. No pneumothorax. No acute osseous abnormality. IMPRESSION: No active cardiopulmonary disease. Electronically Signed   By: Iven Finn M.D.   On: 08/13/2021 18:21   CT Head Wo Contrast  Result Date: 08/13/2021 CLINICAL DATA:  Dizziness. EXAM: CT HEAD WITHOUT CONTRAST TECHNIQUE: Contiguous axial images were obtained from the base of the skull through the vertex without intravenous contrast. RADIATION DOSE REDUCTION: This exam was performed according to the departmental dose-optimization program which includes automated exposure control, adjustment of the mA and/or kV according to patient size and/or use of iterative reconstruction technique. COMPARISON:  Head CT dated 11/24/2018. FINDINGS: Brain: The ventricles and sulci are appropriate size for the patient's age. The gray-white matter discrimination is preserved. There is no acute intracranial hemorrhage. No mass effect or midline shift. No extra-axial fluid collection. Vascular: No hyperdense vessel or unexpected calcification. Skull: Normal. Negative for fracture or focal lesion. Sinuses/Orbits: No acute finding. Other: None IMPRESSION: No acute intracranial pathology. Electronically Signed   By: Anner Crete M.D.   On: 08/13/2021 22:26    Procedures Procedures    Medications Ordered in ED Medications  potassium chloride SA (KLOR-CON M) CR tablet 40 mEq (40 mEq Oral Given 08/13/21 1955)  lactated ringers bolus 1,000 mL (0 mLs Intravenous Stopped 08/13/21 2152)  ondansetron (ZOFRAN) injection 4 mg (4 mg  Intravenous Given 08/13/21 2023)  ondansetron (ZOFRAN-ODT) disintegrating tablet 4 mg (4 mg Oral Given 08/13/21 2202)  potassium chloride SA (KLOR-CON M) CR tablet 40 mEq (40 mEq Oral Given 08/13/21 2202)    ED Course/ Medical Decision Making/ A&P                           Medical Decision Making Amount and/or Complexity of Data Reviewed Labs: ordered. Radiology: ordered.  Risk Prescription drug management.   This patient presents to the ED with chief complaint(s) of dizziness, shortness of breath, neck pain with pertinent past medical history of hyperlipidemia which further complicates the presenting complaint.   Patient has no focal neurodeficits.  She has no nystagmus, negative skew.  She also denies vertigo, indicates that her dizziness, which is intermittent is more unsteadiness.  She is complaining  of some neck pain, but it is intermittent.  Patient has no meningismus on exam, no carotid bruits.  She denies any trauma.  Patient is reporting some shortness of breath, but there is no chest pain.  She has constellation of symptoms that are vague, but she has no significant medical history, no significant cardiovascular risk factors besides hyperlipidemia and her social history and her family history is reassuring.  The differential diagnosis includes : Orthostatic hypotension, severe electrolyte abnormality, severe anemia, atypical presentation of ACS.  TIA and stroke considered, however patient is not having constant symptoms and her symptoms are not consistent with specific vascular territory.  She does not have vertigo, instead has unsteadiness which is not hallmark of posterior circulation or basilar circulation finding.  We ambulated the patient in the ED.  She had no unsteady gait.  Her neuro exam, ocular exam is reassuring.  The initial plan is to order basic labs, get troponins and get a CT scan of the brain.  Patient has contrast allergy, I think CT angiogram in the ED would be  unnecessary anyways.  Additional history obtained: Records reviewed Care Everywhere/External Records  Independent labs interpretation:  The following labs were independently interpreted: Hypokalemia, potassium was 2.7.  Patient also has no anemia.  Creatinine is slightly elevated compared to her baseline.  Independent visualization of imaging: - I independently visualized the following imaging with scope of interpretation limited to determining acute life threatening conditions related to emergency care: CT scan of the brain, which revealed no evidence of brain bleed  Treatment and Reassessment: Patient reassessed.  Results discussed with her.  Advised that she needs to follow-up with her PCP in 1 week if her symptoms persist and to return to the ER if she starts having focal neurologic deficits or worsening of her symptoms.  Patient in agreement with the plan.  Final Clinical Impression(s) / ED Diagnoses Final diagnoses:  Dizziness  Nausea    Rx / DC Orders ED Discharge Orders          Ordered    ondansetron (ZOFRAN-ODT) 8 MG disintegrating tablet  Every 8 hours PRN        08/13/21 2235              Varney Biles, MD 08/13/21 2247

## 2021-08-13 NOTE — Discharge Instructions (Signed)
You are seen in the ER for headache, dizziness and nausea. It is unclear what is causing your symptoms right now.  The work-up in the ER included metabolic work-up and cardiac work-up which is reassuring besides low potassium which was replaced in the ER.  CT scan of the brain shows no evidence of any brain bleed or large mass.  There is no old strokes seen either.  The work-up in the ER is limited to emergent work-up only.  Therefore, if your symptoms persist despite taking nausea medicine and hydrating well we recommend that you follow-up with your primary care doctor in 1 week to see if there are other causes for your symptoms that require additional testing like Dopplers of your blood vessels going to the brain or consultation with other specialist.  Return to the ER immediately if you start having one-sided numbness, weakness, slurred speech, vision loss, persistent severe dizziness associated with nausea or vision loss or weakness.

## 2021-08-13 NOTE — ED Triage Notes (Signed)
Pt reports dizziness, lightheadedness, nausea and SOB since 1100.  Worsening thorughtout the day  States neck tightness  Denies any chest pain

## 2021-08-13 NOTE — ED Notes (Signed)
Patient transported to CT 

## 2021-08-13 NOTE — ED Notes (Signed)
Pt ambulated independently to bathroom, approximately 30 ft. Steady gait, did not report any dizziness throughout

## 2021-08-17 ENCOUNTER — Emergency Department (HOSPITAL_BASED_OUTPATIENT_CLINIC_OR_DEPARTMENT_OTHER): Payer: 59

## 2021-08-17 ENCOUNTER — Other Ambulatory Visit: Payer: Self-pay

## 2021-08-17 ENCOUNTER — Encounter (HOSPITAL_BASED_OUTPATIENT_CLINIC_OR_DEPARTMENT_OTHER): Payer: Self-pay | Admitting: Emergency Medicine

## 2021-08-17 ENCOUNTER — Emergency Department (HOSPITAL_BASED_OUTPATIENT_CLINIC_OR_DEPARTMENT_OTHER)
Admission: EM | Admit: 2021-08-17 | Discharge: 2021-08-17 | Disposition: A | Discharge status: Home / Self Care | Payer: 59 | Attending: Emergency Medicine | Admitting: Emergency Medicine

## 2021-08-17 DIAGNOSIS — R531 Weakness: Secondary | ICD-10-CM | POA: Insufficient documentation

## 2021-08-17 DIAGNOSIS — Z79899 Other long term (current) drug therapy: Secondary | ICD-10-CM | POA: Diagnosis not present

## 2021-08-17 DIAGNOSIS — R5383 Other fatigue: Secondary | ICD-10-CM | POA: Diagnosis not present

## 2021-08-17 DIAGNOSIS — R630 Anorexia: Secondary | ICD-10-CM | POA: Insufficient documentation

## 2021-08-17 DIAGNOSIS — R42 Dizziness and giddiness: Secondary | ICD-10-CM | POA: Diagnosis not present

## 2021-08-17 DIAGNOSIS — R110 Nausea: Secondary | ICD-10-CM | POA: Insufficient documentation

## 2021-08-17 HISTORY — DX: Essential (primary) hypertension: I10

## 2021-08-17 LAB — URINALYSIS, ROUTINE W REFLEX MICROSCOPIC
Bilirubin Urine: NEGATIVE
Glucose, UA: NEGATIVE mg/dL
Hgb urine dipstick: NEGATIVE
Ketones, ur: NEGATIVE mg/dL
Leukocytes,Ua: NEGATIVE
Nitrite: NEGATIVE
Protein, ur: NEGATIVE mg/dL
Specific Gravity, Urine: 1.01 (ref 1.005–1.030)
pH: 7.5 (ref 5.0–8.0)

## 2021-08-17 LAB — CBC WITH DIFFERENTIAL/PLATELET
Abs Immature Granulocytes: 0.02 10*3/uL (ref 0.00–0.07)
Basophils Absolute: 0 10*3/uL (ref 0.0–0.1)
Basophils Relative: 0 %
Eosinophils Absolute: 0 10*3/uL (ref 0.0–0.5)
Eosinophils Relative: 0 %
HCT: 41.6 % (ref 36.0–46.0)
Hemoglobin: 13.6 g/dL (ref 12.0–15.0)
Immature Granulocytes: 0 %
Lymphocytes Relative: 19 %
Lymphs Abs: 1.1 10*3/uL (ref 0.7–4.0)
MCH: 28 pg (ref 26.0–34.0)
MCHC: 32.7 g/dL (ref 30.0–36.0)
MCV: 85.6 fL (ref 80.0–100.0)
Monocytes Absolute: 0.4 10*3/uL (ref 0.1–1.0)
Monocytes Relative: 6 %
Neutro Abs: 4.1 10*3/uL (ref 1.7–7.7)
Neutrophils Relative %: 75 %
Platelets: 224 10*3/uL (ref 150–400)
RBC: 4.86 MIL/uL (ref 3.87–5.11)
RDW: 13.2 % (ref 11.5–15.5)
WBC: 5.6 10*3/uL (ref 4.0–10.5)
nRBC: 0 % (ref 0.0–0.2)

## 2021-08-17 LAB — LIPASE, BLOOD: Lipase: 30 U/L (ref 11–51)

## 2021-08-17 LAB — COMPREHENSIVE METABOLIC PANEL
ALT: 21 U/L (ref 0–44)
AST: 23 U/L (ref 15–41)
Albumin: 4.2 g/dL (ref 3.5–5.0)
Alkaline Phosphatase: 66 U/L (ref 38–126)
Anion gap: 7 (ref 5–15)
BUN: 9 mg/dL (ref 6–20)
CO2: 28 mmol/L (ref 22–32)
Calcium: 9.4 mg/dL (ref 8.9–10.3)
Chloride: 102 mmol/L (ref 98–111)
Creatinine, Ser: 1.15 mg/dL — ABNORMAL HIGH (ref 0.44–1.00)
GFR, Estimated: 56 mL/min — ABNORMAL LOW (ref >60)
Glucose, Bld: 103 mg/dL — ABNORMAL HIGH (ref 70–99)
Potassium: 4.1 mmol/L (ref 3.5–5.1)
Sodium: 137 mmol/L (ref 135–145)
Total Bilirubin: 0.5 mg/dL (ref 0.3–1.2)
Total Protein: 7.2 g/dL (ref 6.5–8.1)

## 2021-08-17 LAB — MAGNESIUM: Magnesium: 2.2 mg/dL (ref 1.7–2.4)

## 2021-08-17 LAB — TROPONIN I (HIGH SENSITIVITY): Troponin I (High Sensitivity): 2 ng/L (ref <18)

## 2021-08-17 LAB — CBG MONITORING, ED: Glucose-Capillary: 106 mg/dL — ABNORMAL HIGH (ref 70–99)

## 2021-08-17 MED ORDER — MECLIZINE HCL 12.5 MG PO TABS: 12.5000 mg | ORAL_TABLET | Freq: Three times a day (TID) | ORAL | 0 refills | Status: AC | PRN

## 2021-08-17 MED ORDER — SODIUM CHLORIDE 0.9 % IV BOLUS
1000.0000 mL | Freq: Once | INTRAVENOUS | Status: AC
Start: 2021-08-17 — End: 2021-08-17
  Administered 2021-08-17: 1000 mL via INTRAVENOUS

## 2021-08-17 MED ORDER — MECLIZINE HCL 25 MG PO TABS
12.5000 mg | ORAL_TABLET | Freq: Once | ORAL | Status: AC
  Administered 2021-08-17: 12.5 mg via ORAL
  Filled 2021-08-17: qty 1

## 2021-08-17 NOTE — ED Notes (Signed)
Pt reports generalized nausea during orthostatic VS. Pt denies any orthostatic symptoms with change in position.

## 2021-08-17 NOTE — ED Provider Notes (Signed)
[facility] EMERGENCY DEPARTMENT Provider Note   CSN: [id number] Arrival date & time: 08/17/21  1538     History PMH: HTN, asthma, HLD Chief Complaint  Patient presents with   Fatigue    Kelsey Shaw is a 56 y.o. female. Presents the emergency department with complaints of fatigue, lightheadedness, generalized weakness, decreased appetite, and nausea.  She states she has been feeling this way for about 2 weeks now.  She got to the point on Wednesday where she started to feel exceptionally weak and was having tingling of both fingers and toes bilaterally.  She ended up coming to the emergency department on June 7.  She had an overall fairly normal work-up but did have notable hypokalemia with potassium 2.7.  She was given potassium replacement at discharge.  She followed up with her care on Friday where they potassium is back to normal.  This patient was starting to feel little bit better by this point.  She was told to stop her potassium.  She is also been having problems with borderline hypotension.  She was told to stop her HCTZ and telmisartan on Friday.  She is still taking her amlodipine.  She says her blood pressures been up and down since then. She states that today she started to feel very similar to what she did on Wednesday prior to being in the emergency department.  She says that she has been having intermittent generalized weakness, fatigue, paresthesias in all extremities.  She has had some mild shortness of breath when she gets the spells of feeling weak.  She does not think has anything to do with going from a sitting to standing position or when she is exerting herself.  She says it happens very randomly.  She is also talking about having some vague GI symptoms such as nausea, decreased appetite, and intermittent diarrhea over the past several weeks as well.  She has already been referred to gastroenterology for these problems.  HPI     Home Medications Prior to  Admission medications   Medication Sig Start Date End Date Taking? Authorizing Provider  meclizine (ANTIVERT) 12.5 MG tablet Take 1 tablet (12.5 mg total) by mouth 3 (three) times daily as needed for up to 10 days for dizziness. 08/17/21 08/27/21 Yes Suttyn Cryder, Adora Fridge, PA-C  albuterol (VENTOLIN HFA) 108 (90 Base) MCG/ACT inhaler TAKE 2 PUFFS BY MOUTH EVERY 6 HOURS AS NEEDED FOR WHEEZE 03/21/18   [provider]  hydrochlorothiazide (HYDRODIURIL) 25 MG tablet Take by mouth. 06/18/20   [provider]  ondansetron (ZOFRAN-ODT) 8 MG disintegrating tablet Take 1 tablet (8 mg total) by mouth every 8 (eight) hours as needed for nausea. 08/13/21   Varney Biles, MD  telmisartan (MICARDIS) 40 MG tablet Take 1 tablet (40 mg total) by mouth daily. 06/17/20   Molpus, John, MD      Allergies    Ivp dye [iodinated contrast media]    Review of Systems   Review of Systems  Constitutional:  Positive for appetite change and fatigue. Negative for chills and fever.  Respiratory:  Positive for shortness of breath. Negative for cough and chest tightness.   Cardiovascular:  Negative for chest pain, palpitations and leg swelling.  Gastrointestinal:  Positive for diarrhea and nausea. Negative for abdominal pain, anal bleeding, blood in stool, constipation and vomiting.  Endocrine: Positive for polyuria.  Genitourinary:  Negative for dysuria and flank pain.  Musculoskeletal:  Negative for myalgias, neck pain and neck stiffness.  Neurological:  Positive for weakness and light-headedness. Negative for dizziness, tremors, seizures, syncope, facial asymmetry, speech difficulty, numbness and headaches.  Psychiatric/Behavioral:  Negative for confusion.   All other systems reviewed and are negative.   Physical Exam Updated Vital Signs BP (S) 131/79   Pulse (S) 72   Temp 98 F (36.7 C) (Oral)   Resp 14   Ht 5\' 4"  (1.626 m)   Wt 98.9 kg   SpO2 100%   BMI 37.42 kg/m  Physical Exam Vitals and nursing  note reviewed.  Constitutional:      General: She is not in acute distress.    Appearance: Normal appearance. She is not ill-appearing, toxic-appearing or diaphoretic.  HENT:     Head: Normocephalic and atraumatic.     Nose: No nasal deformity.     Mouth/Throat:     Lips: Pink. No lesions.     Mouth: Mucous membranes are moist. No injury, lacerations, oral lesions or angioedema.     Pharynx: Oropharynx is clear. Uvula midline. No pharyngeal swelling, oropharyngeal exudate, posterior oropharyngeal erythema or uvula swelling.  Eyes:     General: Gaze aligned appropriately. No scleral icterus.       Right eye: No discharge.        Left eye: No discharge.     Extraocular Movements: Extraocular movements intact.     Conjunctiva/sclera: Conjunctivae normal.     Right eye: Right conjunctiva is not injected. No exudate or hemorrhage.    Left eye: Left conjunctiva is not injected. No exudate or hemorrhage.    Pupils: Pupils are equal, round, and reactive to light.  Cardiovascular:     Rate and Rhythm: Normal rate and regular rhythm.     Pulses: Normal pulses.          Radial pulses are 2+ on the right side and 2+ on the left side.       Dorsalis pedis pulses are 2+ on the right side and 2+ on the left side.     Heart sounds: Normal heart sounds, S1 normal and S2 normal. Heart sounds not distant. No murmur heard.    No friction rub. No gallop. No S3 or S4 sounds.  Pulmonary:     Effort: Pulmonary effort is normal. No accessory muscle usage or respiratory distress.     Breath sounds: Normal breath sounds. No stridor. No wheezing, rhonchi or rales.  Chest:     Chest wall: No tenderness.  Abdominal:     General: Abdomen is flat. There is no distension.     Palpations: Abdomen is soft. There is no mass or pulsatile mass.     Tenderness: There is no abdominal tenderness. There is no right CVA tenderness, left CVA tenderness, guarding or rebound.  Musculoskeletal:     Right lower leg: No edema.      Left lower leg: No edema.  Skin:    General: Skin is warm and dry.     Coloration: Skin is not jaundiced or pale.     Findings: No bruising, erythema, lesion or rash.  Neurological:     General: No focal deficit present.     Mental Status: She is alert and oriented to person, place, and time.     GCS: GCS eye subscore is 4. GCS verbal subscore is 5. GCS motor subscore is 6.     Comments: Alert and Oriented x 3 Speech clear with no aphasia Cranial Nerve testing - PERRLA. EOM intact. No Nystagmus - No facial asymmetry Motor: -  5/5 motor strength in all four extremities.  Sensation: - Grossly intact in all four extremities.  Coordination:  - Finger to nose and heel to shin intact bilaterally   Psychiatric:        Mood and Affect: Mood normal.        Behavior: Behavior normal. Behavior is cooperative.     ED Results / Procedures / Treatments   Labs (all labs ordered are listed, but only abnormal results are displayed) Labs Reviewed  COMPREHENSIVE METABOLIC PANEL - Abnormal; Notable for the following components:      Result Value   Glucose, Bld 103 (*)    Creatinine, Ser 1.15 (*)    GFR, Estimated 56 (*)    All other components within normal limits  URINALYSIS, ROUTINE W REFLEX MICROSCOPIC - Abnormal; Notable for the following components:   Color, Urine STRAW (*)    All other components within normal limits  CBG MONITORING, ED - Abnormal; Notable for the following components:   Glucose-Capillary 106 (*)    All other components within normal limits  CBC WITH DIFFERENTIAL/PLATELET  MAGNESIUM  LIPASE, BLOOD  TROPONIN I (HIGH SENSITIVITY)    EKG EKG Interpretation  Date/Time:  Sunday August 17 2021 15:50:55 EDT Ventricular Rate:  71 PR Interval:  128 QRS Duration: 68 QT Interval:  352 QTC Calculation: 382 R Axis:   26 Text Interpretation: Normal sinus rhythm with sinus arrhythmia Normal ECG When compared with ECG of 13-Aug-2021 17:50, PREVIOUS ECG IS PRESENT since  last tracing no significant change Confirmed by Rolan BuccoBelfi, Melanie [phone]([id number]) on 08/17/2021 4:11:38 PM  Radiology DG Chest 2 View  Result Date: 08/17/2021 CLINICAL DATA:  Shortness of breath EXAM: CHEST - 2 VIEW COMPARISON:  08/13/2021 FINDINGS: The heart size and mediastinal contours are within normal limits. Both lungs are clear. The visualized skeletal structures are unremarkable. IMPRESSION: No active cardiopulmonary disease. Electronically Signed   By: Ernie AvenaPalani  Rathinasamy M.D.   On: 08/17/2021 16:46    Procedures Procedures  This patient was on telemetry or cardiac monitoring during their time in the ED.    Medications Ordered in ED Medications  sodium chloride 0.9 % bolus 1,000 mL (0 mLs Intravenous Stopped 08/17/21 1801)  meclizine (ANTIVERT) tablet 12.5 mg (12.5 mg Oral Given 08/17/21 1923)    ED Course/ Medical Decision Making/ A&P Clinical Course as of 08/17/21 2049  Sun Aug 17, 2021  1619 On Amlodipine lowest dose. [GL]  1811 Ambulating patient and doing orthostatics [GL]    Clinical Course User Index [GL] Sayra Frisby, Finis BudGrace C, PA-C                           Medical Decision Making Amount and/or Complexity of Data Reviewed Labs: ordered. Radiology: ordered.    MDM  This is a 56 y.o. female who presents to the ED with generalized weakness, fatigue, decreased appetite.  The differential of this patient includes but is not limited to AKI, Electrolyte disturbance, Dehydration, ACS, viral illness, gastroparesis  My Impression, Plan, and ED Course: Well appearing. Vitals stable.  This patient is here with very vague symptoms that have been intermittent for the past couple of weeks.  She was seen here on Wednesday and had diagnosed with hypokalemia.  She has since stopped taking this as her potassium levels had normalized.  She is starting to have very similar symptoms today. Will recheck labs and give IVF.   I personally ordered, reviewed, and interpreted all laboratory work  and  imaging and agree with radiologist interpretation. Results interpreted below:  - CBC without abnormalities or leukocytosis - CMP with stable creatinine from baseline. Potassium is in normal range. No other abnormalities - Lipase 30 - Mag normal - Troponin 2 - UA unremarkable. - CXR with no abnormalities - EKG with NSR and similar to prior  Overall unremarkable workup.  Specifically has normal potassium.  I suspect that her hypokalemia was not what is causing her symptoms and since she has stopped taking her HCTZ she is no longer losing potassium renally.    Still doubt posterior circulating stroke as no other neurological deficits and symptoms are not continuous or consistent with this. She is scheduled to get her carotid arteries ultrasounded outpatient, but at this point, she has no symptoms to suggest stroke or acute occlusion, I do not think that CTA head/neck is indicated.  She can have this worked up outpatient. Troponin negative and EKG unchanged. I doubt ischemic cardiac cause.  Orthostatics are negative. Patient has ambulated without any difficulty at all and no balance disturbances or profound weakness.  Symptoms do seem positional, so have considered BPPV.  Sent home with prescription for meclizine and follow-up with ENT.  PCP follow-up recommended.  Charting Requirements Additional history is obtained from:  Independent historian External Records from outside source obtained and reviewed including: Reviewed prior labs, recent PCP note Social Determinants of Health:  none Pertinant PMH that complicates patient's illness: n/a  Patient Care Problems that were addressed during this visit: - Lightheaded: Acute illness with systemic symptoms This patient was maintained on a cardiac monitor/telemetry. I personally viewed and interpreted the cardiac monitor which reveals an underlying rhythm of NSR Medications given in ED: Meclizine, IVF Reevaluation of the patient after these  medicines showed that the patient improved I have reviewed home medications and made changes accordingly.  Critical Care Interventions: n/a Consultations: n/a Disposition: discharge  Portions of this note were generated with Dragon dictation software. Dictation errors may occur despite best attempts at proofreading.        Final Clinical Impression(s) / ED Diagnoses Final diagnoses:  Lightheaded    Rx / DC Orders ED Discharge Orders          Ordered    meclizine (ANTIVERT) 12.5 MG tablet  3 times daily PRN        08/17/21 1917              Sheila Oats 08/17/21 2049    Malvin Johns, MD 08/17/21 2050

## 2021-08-17 NOTE — ED Triage Notes (Signed)
Pt arrives pov, slow gait, c/o feeling light headed and weak. Reports tx earlier this week, dx with hypokalemia

## 2021-08-17 NOTE — Discharge Instructions (Addendum)
Your labs today were very reassuring. Your potassium level was normal. I have prescribed you meclizine to help with some of your symptoms. Please follow up with your PCP.  I have also provided you a phone number of an ENT provider to be evaluated for positional vertigo. Please call to schedule an appointment.

## 2021-08-17 NOTE — ED Notes (Signed)
Lying flat preparing for orthostatic vs

## 2021-08-17 NOTE — ED Notes (Signed)
Patient ambulatory with steady gait, denies needing assistance with getting dressed.

## 2022-02-12 ENCOUNTER — Other Ambulatory Visit: Payer: Self-pay

## 2022-02-12 ENCOUNTER — Encounter (HOSPITAL_BASED_OUTPATIENT_CLINIC_OR_DEPARTMENT_OTHER): Payer: Self-pay | Admitting: Emergency Medicine

## 2022-02-12 ENCOUNTER — Emergency Department (HOSPITAL_BASED_OUTPATIENT_CLINIC_OR_DEPARTMENT_OTHER): Payer: 59

## 2022-02-12 DIAGNOSIS — S0990XA Unspecified injury of head, initial encounter: Secondary | ICD-10-CM | POA: Insufficient documentation

## 2022-02-12 DIAGNOSIS — W01198A Fall on same level from slipping, tripping and stumbling with subsequent striking against other object, initial encounter: Secondary | ICD-10-CM | POA: Diagnosis not present

## 2022-02-12 DIAGNOSIS — R519 Headache, unspecified: Secondary | ICD-10-CM | POA: Diagnosis present

## 2022-02-12 DIAGNOSIS — S0083XA Contusion of other part of head, initial encounter: Secondary | ICD-10-CM | POA: Diagnosis not present

## 2022-02-12 NOTE — ED Triage Notes (Signed)
Pt fell about 1 hour ago. Impact right side of face. Some swelling noted in triage.

## 2022-02-13 ENCOUNTER — Emergency Department (HOSPITAL_BASED_OUTPATIENT_CLINIC_OR_DEPARTMENT_OTHER): Payer: 59

## 2022-02-13 ENCOUNTER — Emergency Department (HOSPITAL_BASED_OUTPATIENT_CLINIC_OR_DEPARTMENT_OTHER)
Admission: EM | Admit: 2022-02-13 | Discharge: 2022-02-13 | Disposition: A | Discharge status: Home / Self Care | Payer: 59 | Attending: Emergency Medicine | Admitting: Emergency Medicine

## 2022-02-13 ENCOUNTER — Other Ambulatory Visit: Payer: Self-pay

## 2022-02-13 DIAGNOSIS — S0990XA Unspecified injury of head, initial encounter: Secondary | ICD-10-CM

## 2022-02-13 DIAGNOSIS — S9032XA Contusion of left foot, initial encounter: Secondary | ICD-10-CM

## 2022-02-13 DIAGNOSIS — W19XXXA Unspecified fall, initial encounter: Secondary | ICD-10-CM

## 2022-02-13 DIAGNOSIS — S0083XA Contusion of other part of head, initial encounter: Secondary | ICD-10-CM

## 2022-02-13 NOTE — ED Provider Notes (Signed)
[facility] EMERGENCY DEPARTMENT Provider Note   CSN: [id number] Arrival date & time: 02/12/22  2317     History  Chief Complaint  Patient presents with   Facial Injury   Fall    Kelsey Shaw is a 56 y.o. female.  Patient is a 56 year old female presenting with complaints of a fall.  She reports walking on the sidewalk, then tripping and falling forward.  She had her hands in her pockets and so could not brace herself when she fell.  She struck the right side of her face on the ground.  She describes facial pain and headache.  She does believe she was knocked unconscious briefly, but denies neck pain.  She describes some discomfort to her left great toe, but denies injury otherwise.  The history is provided by the patient.       Home Medications Prior to Admission medications   Medication Sig Start Date End Date Taking? Authorizing Provider  albuterol (VENTOLIN HFA) 108 (90 Base) MCG/ACT inhaler TAKE 2 PUFFS BY MOUTH EVERY 6 HOURS AS NEEDED FOR WHEEZE 03/21/18   [provider]  hydrochlorothiazide (HYDRODIURIL) 25 MG tablet Take by mouth. 06/18/20   [provider]  ondansetron (ZOFRAN-ODT) 8 MG disintegrating tablet Take 1 tablet (8 mg total) by mouth every 8 (eight) hours as needed for nausea. 08/13/21   Derwood Kaplan, MD  telmisartan (MICARDIS) 40 MG tablet Take 1 tablet (40 mg total) by mouth daily. 06/17/20   Molpus, John, MD      Allergies    Ivp dye [iodinated contrast media]    Review of Systems   Review of Systems  All other systems reviewed and are negative.   Physical Exam Updated Vital Signs BP 124/64   Pulse 85   Temp 98.3 F (36.8 C) (Oral)   Resp 18   Ht 5\' 4"  (1.626 m)   Wt 99.8 kg   SpO2 100%   BMI 37.76 kg/m  Physical Exam Vitals and nursing note reviewed.  Constitutional:      General: She is not in acute distress.    Appearance: She is well-developed. She is not diaphoretic.  HENT:     Head: Normocephalic.      Comments: There is mild swelling noted of the right cheek and cheek bone, but no obvious deformity Eyes:     Extraocular Movements: Extraocular movements intact.     Pupils: Pupils are equal, round, and reactive to light.  Cardiovascular:     Rate and Rhythm: Normal rate and regular rhythm.     Heart sounds: No murmur heard.    No friction rub. No gallop.  Pulmonary:     Effort: Pulmonary effort is normal. No respiratory distress.     Breath sounds: Normal breath sounds. No wheezing.  Abdominal:     General: Bowel sounds are normal. There is no distension.     Palpations: Abdomen is soft.     Tenderness: There is no abdominal tenderness.  Musculoskeletal:        General: Normal range of motion.     Cervical back: Normal range of motion and neck supple.  Skin:    General: Skin is warm and dry.  Neurological:     General: No focal deficit present.     Mental Status: She is alert and oriented to person, place, and time.     Cranial Nerves: No cranial nerve deficit.     Motor: No weakness.     ED Results /  Procedures / Treatments   Labs (all labs ordered are listed, but only abnormal results are displayed) Labs Reviewed - No data to display  EKG None  Radiology DG Foot Complete Left  Result Date: 02/13/2022 CLINICAL DATA:  Fall and trauma to the left foot. EXAM: LEFT FOOT - COMPLETE 3+ VIEW COMPARISON:  None Available. FINDINGS: There is no acute fracture or dislocation. No significant arthritic changes. The soft tissues are unremarkable. IMPRESSION: Negative. Electronically Signed   By: Elgie Collard M.D.   On: 02/13/2022 00:11    Procedures Procedures    Medications Ordered in ED Medications - No data to display  ED Course/ Medical Decision Making/ A&P  Patient presenting with complaints of a fall, the details of which are described in the HPI.  She has pain to her right cheek and headache.  She arrives here with stable vital signs and is neurologically  intact.  CT scan obtained of the head and maxillofacial bones and both were negative.  X-rays of the left foot also obtained and are negative for fracture.  Patient to be discharged with facial contusion/closed head injury.  She is to return as needed.  Final Clinical Impression(s) / ED Diagnoses Final diagnoses:  None    Rx / DC Orders ED Discharge Orders     None         Geoffery Lyons, MD 02/13/22 [phone]

## 2022-02-13 NOTE — Discharge Instructions (Signed)
Your CT scans are negative for facial fracture or intracranial injury.  Take ibuprofen 600 mg every 6 hours as needed for pain.  Ice affected areas for 20 minutes every 2 hours while awake for the next 2 days.  Return to the emergency department if you develop severe headache, vomiting, or for other new and concerning symptoms.

## 2022-08-20 IMAGING — DX DG CHEST 2V
2 series · 2 of 2 positions shown · non-contrast
Comparison: None.

CLINICAL DATA: Shortness of breath

EXAM:
CHEST - 2 VIEW

[chest pa]
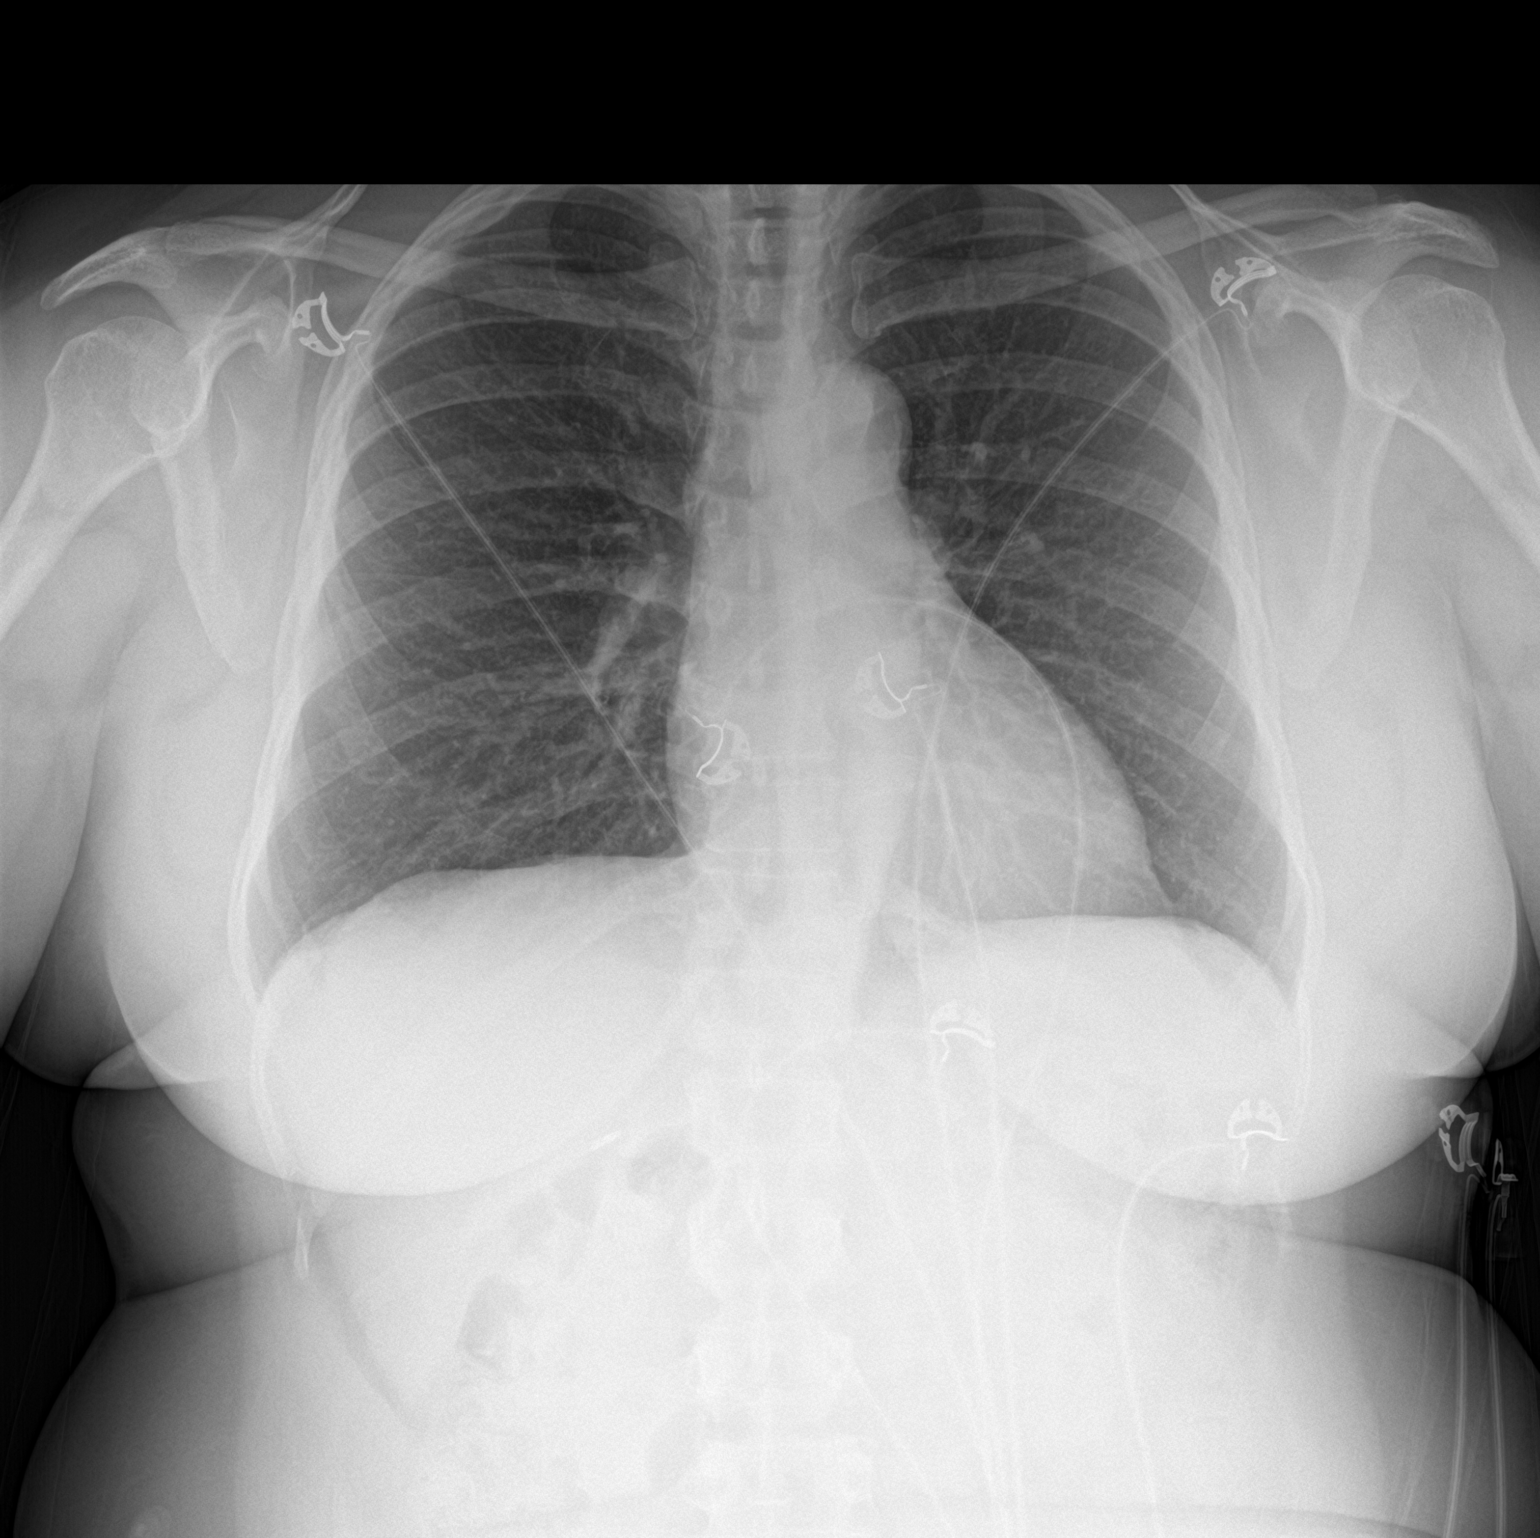

[chest lat]
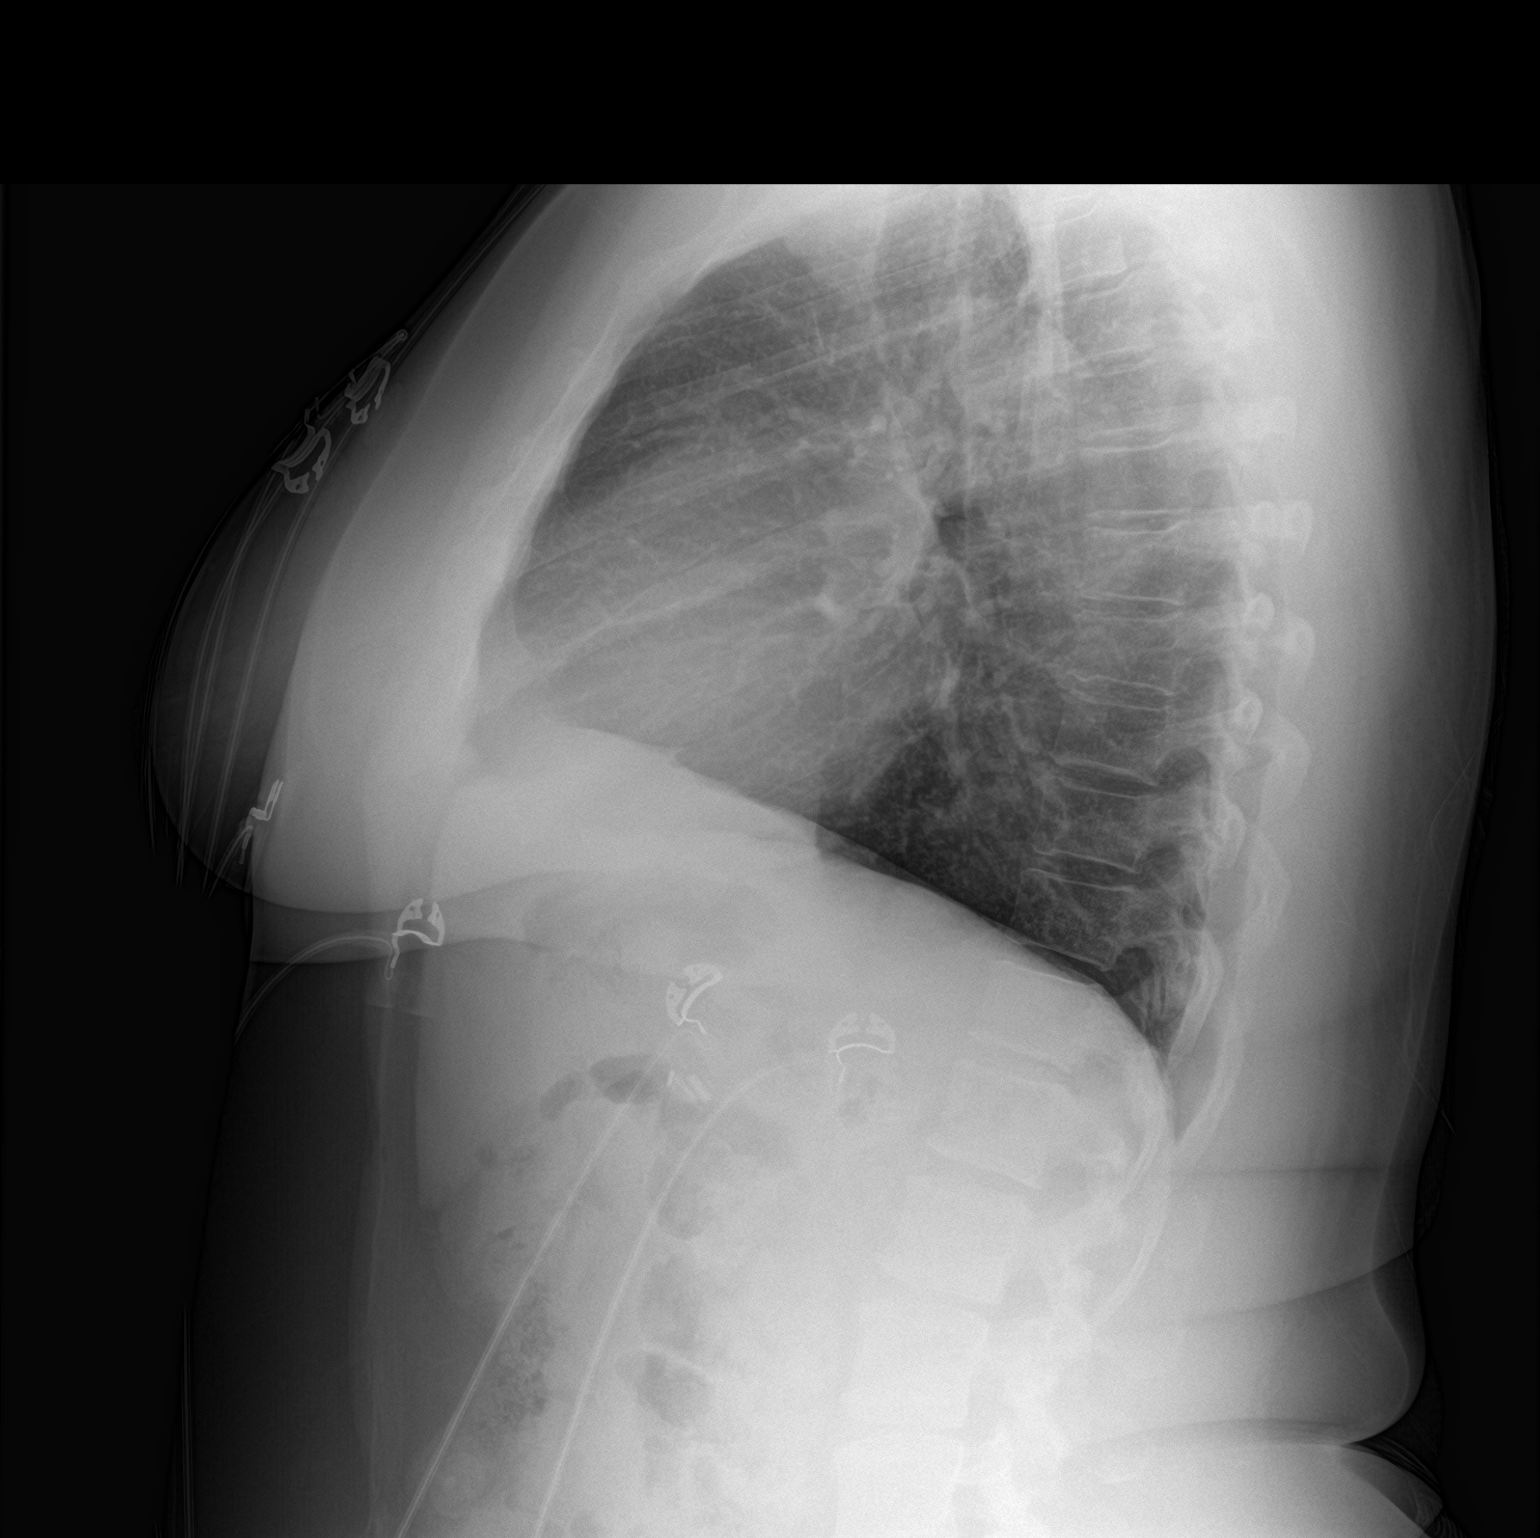

[2 of 2 positions shown; findings below may reference images not displayed]

FINDINGS: The heart size and mediastinal contours are within normal limits.
Both lungs are clear. The visualized skeletal structures are
unremarkable.
IMPRESSION: Normal study.

## 2022-11-27 ENCOUNTER — Emergency Department (HOSPITAL_BASED_OUTPATIENT_CLINIC_OR_DEPARTMENT_OTHER)
Admission: EM | Admit: 2022-11-27 | Discharge: 2022-11-27 | Disposition: A | Discharge status: Home / Self Care | Payer: BLUE CROSS/BLUE SHIELD | Attending: Emergency Medicine | Admitting: Emergency Medicine

## 2022-11-27 ENCOUNTER — Other Ambulatory Visit: Payer: Self-pay

## 2022-11-27 DIAGNOSIS — R09A2 Foreign body sensation, throat: Secondary | ICD-10-CM | POA: Insufficient documentation

## 2022-11-27 DIAGNOSIS — T50905A Adverse effect of unspecified drugs, medicaments and biological substances, initial encounter: Secondary | ICD-10-CM

## 2022-11-27 DIAGNOSIS — Z79899 Other long term (current) drug therapy: Secondary | ICD-10-CM | POA: Insufficient documentation

## 2022-11-27 MED ORDER — ONDANSETRON HCL 4 MG/2ML IJ SOLN
4.0000 mg | Freq: Once | INTRAMUSCULAR | Status: AC
  Administered 2022-11-27: 4 mg via INTRAVENOUS
  Filled 2022-11-27: qty 2

## 2022-11-27 MED ORDER — GLUCAGON HCL RDNA (DIAGNOSTIC) 1 MG IJ SOLR
1.0000 mg | Freq: Once | INTRAMUSCULAR | Status: AC
  Administered 2022-11-27: 1 mg via INTRAVENOUS
  Filled 2022-11-27: qty 1

## 2022-11-27 MED ORDER — LIDOCAINE VISCOUS HCL 2 % MT SOLN
15.0000 mL | Freq: Once | OROMUCOSAL | Status: AC
  Administered 2022-11-27: 15 mL via OROMUCOSAL
  Filled 2022-11-27: qty 15

## 2022-11-27 NOTE — ED Provider Notes (Signed)
[place] EMERGENCY DEPARTMENT AT [facility] Provider Note   CSN: [id number] Arrival date & time: 11/27/22  1125     History  Chief Complaint  Patient presents with   Sore Throat    Kelsey Shaw is a 57 y.o. female.  Who presents emergency department for chief complaint of foreign body sensation in her throat.  Patient reports that she swallowed a magnesium pill earlier today and feels like it is stuck sideways in her throat.  She states that she drank water and soda however she still feels like it is stuck down in the medial part of her esophagus.  She states that when she drinks water she can feel it trickling around the side of this pill.  She denies vomiting or inability to swallow liquids.  She states that it is uncomfortable.  She has no history of the same   Sore Throat       Home Medications Prior to Admission medications   Medication Sig Start Date End Date Taking? Authorizing Provider  albuterol (VENTOLIN HFA) 108 (90 Base) MCG/ACT inhaler TAKE 2 PUFFS BY MOUTH EVERY 6 HOURS AS NEEDED FOR WHEEZE 03/21/18   [provider]  hydrochlorothiazide (HYDRODIURIL) 25 MG tablet Take by mouth. 06/18/20   [provider]  ondansetron (ZOFRAN-ODT) 8 MG disintegrating tablet Take 1 tablet (8 mg total) by mouth every 8 (eight) hours as needed for nausea. 08/13/21   Derwood Kaplan, MD  telmisartan (MICARDIS) 40 MG tablet Take 1 tablet (40 mg total) by mouth daily. 06/17/20   Molpus, John, MD      Allergies    Ivp dye [iodinated contrast media]    Review of Systems   Review of Systems  Physical Exam Updated Vital Signs BP (!) 148/91   Pulse (!) 102   Temp 97.7 F (36.5 C) (Oral)   Resp 17   Ht 5\' 4"  (1.626 m)   Wt 101.6 kg   SpO2 100%   BMI 38.45 kg/m  Physical Exam Vitals and nursing note reviewed.  Constitutional:      General: She is not in acute distress.    Appearance: She is well-developed. She is not diaphoretic.  HENT:      Head: Normocephalic and atraumatic.     Right Ear: External ear normal.     Left Ear: External ear normal.     Nose: Nose normal.     Mouth/Throat:     Mouth: Mucous membranes are moist.  Eyes:     General: No scleral icterus.    Conjunctiva/sclera: Conjunctivae normal.  Cardiovascular:     Rate and Rhythm: Normal rate and regular rhythm.     Heart sounds: Normal heart sounds. No murmur heard.    No friction rub. No gallop.  Pulmonary:     Effort: Pulmonary effort is normal. No respiratory distress.     Breath sounds: Normal breath sounds.  Abdominal:     General: Bowel sounds are normal. There is no distension.     Palpations: Abdomen is soft. There is no mass.     Tenderness: There is no abdominal tenderness. There is no guarding.  Musculoskeletal:     Cervical back: Normal range of motion.  Skin:    General: Skin is warm and dry.  Neurological:     Mental Status: She is alert and oriented to person, place, and time.  Psychiatric:        Behavior: Behavior normal.     ED Results / Procedures /  Treatments   Labs (all labs ordered are listed, but only abnormal results are displayed) Labs Reviewed - No data to display  EKG None  Radiology No results found.  Procedures Procedures    Medications Ordered in ED Medications  lidocaine (XYLOCAINE) 2 % viscous mouth solution 15 mL (has no administration in time range)    ED Course/ Medical Decision Making/ A&P Clinical Course as of 11/27/22 1833  Fri Nov 27, 2022  1416 Patient reevaluated.  She is still having significant discomfort despite taking viscous lidocaine and soda.  She has not vomited.  I doubt pill esophagitis. [AH]  1545 Patient reevaluated after glucagon.  No improvement.  Will consult with GI [AH]  1549 Case discussed with Dr. Elnoria Howard.  Patient does admit that she was able to eat banana earlier this morning.  He states that if she was able to swallow banana that the pill is likely down and this is  representative of pill esophagitis and should resolve on its own. [AH]    Clinical Course User Index [AH] Arthor Captain, PA-C                                 Medical Decision Making 57 year old female who presents with sensation of pill stuck in her esophagus.  Differential diagnosis includes esophagitis, globus pharyngeus, food bolus impaction. Patient is able to swallow fluids and food.  Patient given multiple attempts to resolved sensation of pill stuck in her throat.  Patient able to tolerate food and fluid and case discussed with Dr. Audley Hose as per the ED course.  Patient provided with reassurance that this likely just esophagitis and inflammation shin of the esophageal lining from having the pill irritate the lining of her esophagus going down.  This should resolve in a few days.  In the meantime she may proceed with soft diet.  I have discussed return precautions specifically with the patient at bedside.  She is comfortable with discharge at this time and understands reasons to seek immediate medical care.  Risk Prescription drug management.           Final Clinical Impression(s) / ED Diagnoses Final diagnoses:  None    Rx / DC Orders ED Discharge Orders     None         Arthor Captain, PA-C 11/27/22 Natividad Brood, MD 11/30/22 [phone]

## 2022-11-27 NOTE — ED Triage Notes (Signed)
Patient presents to ED via POV from home. Here with "pill stuck in throat" sensation after taking her magnesium tablet today. Reports she has ate and drank since. Able to keep food/fluids down. Even and non labored respirations noted.

## 2022-11-27 NOTE — Discharge Instructions (Signed)
Get help right away if: You have sudden severe pain in your arms, neck, jaw, teeth, or back. You suddenly feel sweaty, dizzy, or light-headed. You have chest pain or shortness of breath. You vomit and the vomit is green, yellow, or black, or it looks like blood or coffee grounds. Your stool is red, bloody, or black. You have a fever. You cannot swallow, drink, or eat. These symptoms may represent a serious problem that is an emergency. Do not wait to see if the symptoms will go away. Get medical help right away. Call your local emergency services (911 in the [place] Do not drive yourself to the hospital.

## 2023-01-06 ENCOUNTER — Other Ambulatory Visit: Payer: Self-pay

## 2023-01-06 ENCOUNTER — Emergency Department (HOSPITAL_BASED_OUTPATIENT_CLINIC_OR_DEPARTMENT_OTHER)
Admission: EM | Admit: 2023-01-06 | Discharge: 2023-01-06 | Disposition: A | Discharge status: Home / Self Care | Payer: BLUE CROSS/BLUE SHIELD | Attending: Emergency Medicine | Admitting: Emergency Medicine

## 2023-01-06 ENCOUNTER — Encounter (HOSPITAL_BASED_OUTPATIENT_CLINIC_OR_DEPARTMENT_OTHER): Payer: Self-pay

## 2023-01-06 DIAGNOSIS — T887XXA Unspecified adverse effect of drug or medicament, initial encounter: Secondary | ICD-10-CM | POA: Insufficient documentation

## 2023-01-06 DIAGNOSIS — T50905A Adverse effect of unspecified drugs, medicaments and biological substances, initial encounter: Secondary | ICD-10-CM | POA: Insufficient documentation

## 2023-01-06 DIAGNOSIS — J45909 Unspecified asthma, uncomplicated: Secondary | ICD-10-CM | POA: Diagnosis not present

## 2023-01-06 DIAGNOSIS — Z79899 Other long term (current) drug therapy: Secondary | ICD-10-CM | POA: Diagnosis not present

## 2023-01-06 DIAGNOSIS — Z7951 Long term (current) use of inhaled steroids: Secondary | ICD-10-CM | POA: Insufficient documentation

## 2023-01-06 DIAGNOSIS — T50911A Poisoning by multiple unspecified drugs, medicaments and biological substances, accidental (unintentional), initial encounter: Secondary | ICD-10-CM

## 2023-01-06 DIAGNOSIS — I10 Essential (primary) hypertension: Secondary | ICD-10-CM | POA: Diagnosis not present

## 2023-01-06 LAB — CBC WITH DIFFERENTIAL/PLATELET
Abs Immature Granulocytes: 0.02 10*3/uL (ref 0.00–0.07)
Basophils Absolute: 0 10*3/uL (ref 0.0–0.1)
Basophils Relative: 0 %
Eosinophils Absolute: 0 10*3/uL (ref 0.0–0.5)
Eosinophils Relative: 0 %
HCT: 38.1 % (ref 36.0–46.0)
Hemoglobin: 12.1 g/dL (ref 12.0–15.0)
Immature Granulocytes: 0 %
Lymphocytes Relative: 18 %
Lymphs Abs: 1.1 10*3/uL (ref 0.7–4.0)
MCH: 26.5 pg (ref 26.0–34.0)
MCHC: 31.8 g/dL (ref 30.0–36.0)
MCV: 83.6 fL (ref 80.0–100.0)
Monocytes Absolute: 0.4 10*3/uL (ref 0.1–1.0)
Monocytes Relative: 8 %
Neutro Abs: 4.2 10*3/uL (ref 1.7–7.7)
Neutrophils Relative %: 74 %
Platelets: 183 10*3/uL (ref 150–400)
RBC: 4.56 MIL/uL (ref 3.87–5.11)
RDW: 13.9 % (ref 11.5–15.5)
WBC: 5.7 10*3/uL (ref 4.0–10.5)
nRBC: 0 % (ref 0.0–0.2)

## 2023-01-06 LAB — COMPREHENSIVE METABOLIC PANEL
ALT: 21 U/L (ref 0–44)
AST: 28 U/L (ref 15–41)
Albumin: 3.9 g/dL (ref 3.5–5.0)
Alkaline Phosphatase: 68 U/L (ref 38–126)
Anion gap: 11 (ref 5–15)
BUN: 16 mg/dL (ref 6–20)
CO2: 26 mmol/L (ref 22–32)
Calcium: 8.7 mg/dL — ABNORMAL LOW (ref 8.9–10.3)
Chloride: 98 mmol/L (ref 98–111)
Creatinine, Ser: 1 mg/dL (ref 0.44–1.00)
GFR, Estimated: >60 mL/min (ref >60)
Glucose, Bld: 117 mg/dL — ABNORMAL HIGH (ref 70–99)
Potassium: 3.8 mmol/L (ref 3.5–5.1)
Sodium: 135 mmol/L (ref 135–145)
Total Bilirubin: 0.6 mg/dL (ref 0.3–1.2)
Total Protein: 7.4 g/dL (ref 6.5–8.1)

## 2023-01-06 NOTE — ED Triage Notes (Addendum)
Pt reports she mistakenly took her morning meds tonight at 1945. She took them this morning at her regular time at 0700. She took the followings doses:  Metoprolol 50mg  Atorvastatin 10mg  Amlodipine 5mg  Sertraline 100mg  Lamotrigine 25mg    She did not take her nighttime medications. She reports feeling jittery, nauseous and dizzy. Denies pain anywhere. GCS 15, alert and oriented.

## 2023-01-06 NOTE — Discharge Instructions (Signed)
Skip your morning dose of medications, otherwise no additional treatment is needed at this time, you are okay to return home, please contact your primary care doctor in the morning if you have any concern for ongoing symptoms otherwise you can resume your normal medications the morning after.

## 2023-01-06 NOTE — ED Notes (Signed)
Pt. Here due taking meds x 2 with no symptoms that show on Monitor or B/P .

## 2023-01-06 NOTE — ED Notes (Signed)
This RN is currently on hold with poison control.

## 2023-01-06 NOTE — ED Notes (Signed)
Poison control states she did not take enough of the medications to warrant a referral to the ER, but because she is feeling jittery they do recommend her to be monitored and administer her some IV fluids. They state no labs would be needed at this time. Poison Control recommends she has someone stay the night with her as well.

## 2023-01-06 NOTE — ED Provider Notes (Signed)
[place] [facility] AT [facility] Provider Note   CSN: [id number] Arrival date & time: 01/06/23  2040     History  Chief Complaint  Patient presents with   Drug Overdose    Kelsey Shaw is a 57 y.o. female with past medical history significant for hypertension, depression, asthma, hyperlipidemia presents with concern for unintentional overdose on medications.  Patient reports that she normally takes her morning medications around 7 AM, but excellently took them tonight at 7:45 PM.  She listed 5 medications, metoprolol 50 mg, atorvastatin 10 mg, amlodipine 5 mg, sertraline 100 mg, and lamotrigine 25 mg.  Patient reports that she was feeling jittery, nauseous and dizzy initially.  She denies any pain.  On repeat evaluation around 1 hour after arrival patient reports that she is not having any ongoing symptoms, her jitteriness has resolved, and she is not having any feeling of lightheadedness, dizziness.  Patient reports that she contacted her PCP and they told her to come to the ED since she was having some jitteriness and nausea.   Drug Overdose       Home Medications Prior to Admission medications   Medication Sig Start Date End Date Taking? Authorizing Provider  albuterol (VENTOLIN HFA) 108 (90 Base) MCG/ACT inhaler TAKE 2 PUFFS BY MOUTH EVERY 6 HOURS AS NEEDED FOR WHEEZE 03/21/18   [provider]  hydrochlorothiazide (HYDRODIURIL) 25 MG tablet Take by mouth. 06/18/20   [provider]  ondansetron (ZOFRAN-ODT) 8 MG disintegrating tablet Take 1 tablet (8 mg total) by mouth every 8 (eight) hours as needed for nausea. 08/13/21   Derwood Kaplan, MD  telmisartan (MICARDIS) 40 MG tablet Take 1 tablet (40 mg total) by mouth daily. 06/17/20   Molpus, John, MD      Allergies    Ivp dye [iodinated contrast media]    Review of Systems   Review of Systems  All other systems reviewed and are negative.   Physical Exam Updated Vital Signs BP  119/79   Pulse 70   Temp 99 F (37.2 C)   Resp 14   Ht 5\' 4"  (1.626 m)   Wt 102.1 kg   SpO2 95%   BMI 38.62 kg/m  Physical Exam Vitals and nursing note reviewed.  Constitutional:      General: She is not in acute distress.    Appearance: Normal appearance.  HENT:     Head: Normocephalic and atraumatic.  Eyes:     General:        Right eye: No discharge.        Left eye: No discharge.  Cardiovascular:     Rate and Rhythm: Normal rate and regular rhythm.     Heart sounds: No murmur heard.    No friction rub. No gallop.  Pulmonary:     Effort: Pulmonary effort is normal.     Breath sounds: Normal breath sounds.  Abdominal:     General: Bowel sounds are normal.     Palpations: Abdomen is soft.  Skin:    General: Skin is warm and dry.     Capillary Refill: Capillary refill takes less than 2 seconds.     Comments: No overt skin redness  Neurological:     Mental Status: She is alert and oriented to person, place, and time.  Psychiatric:        Mood and Affect: Mood normal.        Behavior: Behavior normal.     ED Results / Procedures /  Treatments   Labs (all labs ordered are listed, but only abnormal results are displayed) Labs Reviewed  COMPREHENSIVE METABOLIC PANEL - Abnormal; Notable for the following components:      Result Value   Glucose, Bld 117 (*)    Calcium 8.7 (*)    All other components within normal limits  CBC WITH DIFFERENTIAL/PLATELET    EKG EKG Interpretation Date/Time:  Wednesday January 06 2023 20:50:16 EDT Ventricular Rate:  94 PR Interval:  136 QRS Duration:  70 QT Interval:  324 QTC Calculation: 405 R Axis:   32  Text Interpretation: Normal sinus rhythm Normal ECG When compared with ECG of 17-Aug-2021 15:50, No significant change since last tracing Confirmed by Alvira Monday ([id number]) on 01/06/2023 11:14:32 PM  Radiology No results found.  Procedures Procedures    Medications Ordered in ED Medications - No data to  display  ED Course/ Medical Decision Making/ A&P                                 Medical Decision Making Amount and/or Complexity of Data Reviewed Labs: ordered.   This patient is a 58 y.o. female  who presents to the ED for concern of unintentional overdose.   Differential diagnoses prior to evaluation: The emergent differential diagnosis includes, but is not limited to, serotonin syndrome, overdose of beta-blocker, Lamictal overdose, versus other. This is not an exhaustive differential.   Past Medical History / Co-morbidities / Social History: Hypertension, hyperlipidemia, depression, asthma  Physical Exam: Physical exam performed. The pertinent findings include: Initially with some tachycardia, pulse of 111, blood pressure 140/92.  On repeat evaluation vital signs stable, no tachycardia, she has not developed a fever, stable oxygen saturation on room air, and normotensive blood pressure, blood pressure 123/82.  She has not experienced any overt hypotension or bradycardia in the ED.  Lab Tests/Imaging studies: I personally interpreted labs/imaging and the pertinent results include: CBC unremarkable, BMP unremarkable, mildly elevated glucose of 117.  Cardiac monitoring: EKG obtained and interpreted by myself and attending physician which shows: Normal sinus rhythm   Medications: Patient instructed to not take her dose of medications in the morning, otherwise no additional treatment needed at this time, with 1 additional dose 12 hours from her normal dose of medications have overall very low clinical suspicion for developing serotonin syndrome, Lamictal overdose, beta-blocker overdose.  She was monitored for several hours in the emergency department with no deterioration of symptoms, and she is feeling improved at time of discharge.   Disposition: After consideration of the diagnostic results and the patients response to treatment, I feel that patient is stable for discharge with no  additional treatment, close PCP follow-up, her initial jitteriness, nausea may be secondary to her extra dose of sertraline, increase serotonin effect, but she does not seem to be having downstream effects of her additional dose of beta-blocker or other medications at this time.  emergency department workup does not suggest an emergent condition requiring admission or immediate intervention beyond what has been performed at this time. The plan is: as above. The patient is safe for discharge and has been instructed to return immediately for worsening symptoms, change in symptoms or any other concerns.  Final Clinical Impression(s) / ED Diagnoses Final diagnoses:  Multiple drug overdose, accidental or unintentional, initial encounter    Rx / DC Orders ED Discharge Orders     None  Olene Floss, PA-C 01/06/23 2354    Alvira Monday, MD 01/07/23 1351

## 2023-04-26 ENCOUNTER — Other Ambulatory Visit: Payer: Self-pay

## 2023-04-26 ENCOUNTER — Emergency Department (HOSPITAL_BASED_OUTPATIENT_CLINIC_OR_DEPARTMENT_OTHER)
Admission: EM | Admit: 2023-04-26 | Discharge: 2023-04-27 | Discharge status: Against Medical Advice | Payer: 59 | Attending: Emergency Medicine | Admitting: Emergency Medicine

## 2023-04-26 ENCOUNTER — Encounter (HOSPITAL_BASED_OUTPATIENT_CLINIC_OR_DEPARTMENT_OTHER): Payer: Self-pay | Admitting: Emergency Medicine

## 2023-04-26 DIAGNOSIS — R059 Cough, unspecified: Secondary | ICD-10-CM | POA: Diagnosis present

## 2023-04-26 DIAGNOSIS — R1084 Generalized abdominal pain: Secondary | ICD-10-CM | POA: Insufficient documentation

## 2023-04-26 DIAGNOSIS — R5383 Other fatigue: Secondary | ICD-10-CM | POA: Insufficient documentation

## 2023-04-26 DIAGNOSIS — R110 Nausea: Secondary | ICD-10-CM | POA: Diagnosis not present

## 2023-04-26 DIAGNOSIS — Z5321 Procedure and treatment not carried out due to patient leaving prior to being seen by health care provider: Secondary | ICD-10-CM | POA: Diagnosis not present

## 2023-04-26 LAB — URINALYSIS, ROUTINE W REFLEX MICROSCOPIC
Bilirubin Urine: NEGATIVE
Glucose, UA: NEGATIVE mg/dL
Hgb urine dipstick: NEGATIVE
Ketones, ur: NEGATIVE mg/dL
Leukocytes,Ua: NEGATIVE
Nitrite: NEGATIVE
Protein, ur: NEGATIVE mg/dL
Specific Gravity, Urine: 1.01 (ref 1.005–1.030)
pH: 7 (ref 5.0–8.0)

## 2023-04-26 LAB — CBC
HCT: 40.6 % (ref 36.0–46.0)
Hemoglobin: 12.6 g/dL (ref 12.0–15.0)
MCH: 26.3 pg (ref 26.0–34.0)
MCHC: 31 g/dL (ref 30.0–36.0)
MCV: 84.8 fL (ref 80.0–100.0)
Platelets: 176 10*3/uL (ref 150–400)
RBC: 4.79 MIL/uL (ref 3.87–5.11)
RDW: 14.6 % (ref 11.5–15.5)
WBC: 5 10*3/uL (ref 4.0–10.5)
nRBC: 0 % (ref 0.0–0.2)

## 2023-04-26 LAB — RESP PANEL BY RT-PCR (RSV, FLU A&B, COVID)  RVPGX2
Influenza A by PCR: NEGATIVE
Influenza B by PCR: NEGATIVE
Resp Syncytial Virus by PCR: NEGATIVE
SARS Coronavirus 2 by RT PCR: NEGATIVE

## 2023-04-26 LAB — COMPREHENSIVE METABOLIC PANEL WITH GFR
ALT: 25 U/L (ref 0–44)
AST: 27 U/L (ref 15–41)
Albumin: 4.4 g/dL (ref 3.5–5.0)
Alkaline Phosphatase: 71 U/L (ref 38–126)
Anion gap: 10 (ref 5–15)
BUN: 7 mg/dL (ref 6–20)
CO2: 27 mmol/L (ref 22–32)
Calcium: 9 mg/dL (ref 8.9–10.3)
Chloride: 100 mmol/L (ref 98–111)
Creatinine, Ser: 1.07 mg/dL — ABNORMAL HIGH (ref 0.44–1.00)
GFR, Estimated: >60 mL/min
Glucose, Bld: 100 mg/dL — ABNORMAL HIGH (ref 70–99)
Potassium: 3.8 mmol/L (ref 3.5–5.1)
Sodium: 137 mmol/L (ref 135–145)
Total Bilirubin: 0.6 mg/dL (ref 0.0–1.2)
Total Protein: 7.8 g/dL (ref 6.5–8.1)

## 2023-04-26 LAB — LIPASE, BLOOD: Lipase: 32 U/L (ref 11–51)

## 2023-04-26 MED ORDER — ONDANSETRON 4 MG PO TBDP
4.0000 mg | ORAL_TABLET | Freq: Once | ORAL | Status: AC | PRN
  Administered 2023-04-26: 4 mg via ORAL
  Filled 2023-04-26: qty 1

## 2023-04-26 NOTE — ED Triage Notes (Signed)
Pt to ED from home c/o cough, fatigue, nausea, generalized abd pain since this past Thursday.
